# Patient Record
Sex: Female | Born: 1960 | Hispanic: No | Marital: Single | Smoking: Never smoker
Health system: Southern US, Community
[De-identification: ages and names within clinical notes are randomized; demographics above are authoritative.]

## PROBLEM LIST (undated history)

## (undated) DIAGNOSIS — J45909 Unspecified asthma, uncomplicated: Secondary | ICD-10-CM

## (undated) DIAGNOSIS — K219 Gastro-esophageal reflux disease without esophagitis: Secondary | ICD-10-CM

---

## 2012-07-22 ENCOUNTER — Encounter (HOSPITAL_COMMUNITY): Payer: Self-pay | Admitting: Emergency Medicine

## 2012-07-22 ENCOUNTER — Emergency Department (HOSPITAL_COMMUNITY)
Admission: EM | Admit: 2012-07-22 | Discharge: 2012-07-22 | Disposition: A | Discharge status: Home / Self Care | Payer: BC Managed Care – PPO | Attending: Emergency Medicine | Admitting: Emergency Medicine

## 2012-07-22 DIAGNOSIS — R61 Generalized hyperhidrosis: Secondary | ICD-10-CM | POA: Insufficient documentation

## 2012-07-22 DIAGNOSIS — F411 Generalized anxiety disorder: Secondary | ICD-10-CM | POA: Insufficient documentation

## 2012-07-22 DIAGNOSIS — J45909 Unspecified asthma, uncomplicated: Secondary | ICD-10-CM | POA: Insufficient documentation

## 2012-07-22 DIAGNOSIS — L500 Allergic urticaria: Secondary | ICD-10-CM | POA: Insufficient documentation

## 2012-07-22 DIAGNOSIS — R0682 Tachypnea, not elsewhere classified: Secondary | ICD-10-CM | POA: Insufficient documentation

## 2012-07-22 DIAGNOSIS — T50995A Adverse effect of other drugs, medicaments and biological substances, initial encounter: Secondary | ICD-10-CM | POA: Insufficient documentation

## 2012-07-22 DIAGNOSIS — T782XXA Anaphylactic shock, unspecified, initial encounter: Secondary | ICD-10-CM

## 2012-07-22 DIAGNOSIS — R000 Tachycardia, unspecified: Secondary | ICD-10-CM | POA: Insufficient documentation

## 2012-07-22 HISTORY — DX: Unspecified asthma, uncomplicated: J45.909

## 2012-07-22 MED ORDER — EPINEPHRINE 0.3 MG/0.3ML IJ SOAJ
0.3000 mg | INTRAMUSCULAR | Status: AC
  Administered 2012-07-22: 0.3 mg via INTRAMUSCULAR

## 2012-07-22 MED ORDER — SODIUM CHLORIDE 0.9 % IV SOLN
Freq: Once | INTRAVENOUS | Status: AC
  Administered 2012-07-22: 11:00:00 via INTRAVENOUS

## 2012-07-22 MED ORDER — DIPHENHYDRAMINE HCL 25 MG PO CAPS: 25.0000 mg | ORAL_CAPSULE | Freq: Three times a day (TID) | ORAL | Status: DC

## 2012-07-22 MED ORDER — ALBUTEROL SULFATE (5 MG/ML) 0.5% IN NEBU
2.5000 mg | INHALATION_SOLUTION | Freq: Once | RESPIRATORY_TRACT | Status: AC
  Administered 2012-07-22: 2.5 mg via RESPIRATORY_TRACT
  Filled 2012-07-22: qty 0.5

## 2012-07-22 MED ORDER — DEXAMETHASONE SODIUM PHOSPHATE 10 MG/ML IJ SOLN
10.0000 mg | Freq: Once | INTRAMUSCULAR | Status: AC
  Administered 2012-07-22: 10 mg via INTRAVENOUS
  Filled 2012-07-22: qty 1

## 2012-07-22 MED ORDER — DIPHENHYDRAMINE HCL 50 MG/ML IJ SOLN
50.0000 mg | Freq: Once | INTRAMUSCULAR | Status: AC
  Administered 2012-07-22: 50 mg via INTRAVENOUS
  Filled 2012-07-22: qty 1

## 2012-07-22 MED ORDER — PREDNISONE 20 MG PO TABS: 60.0000 mg | ORAL_TABLET | Freq: Every day | ORAL | Status: DC

## 2012-07-22 MED ORDER — FAMOTIDINE 20 MG PO TABS: 20.0000 mg | ORAL_TABLET | Freq: Two times a day (BID) | ORAL | Status: DC

## 2012-07-22 MED ORDER — EPINEPHRINE 0.3 MG/0.3ML IJ SOAJ
INTRAMUSCULAR | Status: AC
  Filled 2012-07-22: qty 0.3

## 2012-07-22 MED ORDER — IPRATROPIUM BROMIDE 0.02 % IN SOLN
0.5000 mg | Freq: Once | RESPIRATORY_TRACT | Status: AC
  Administered 2012-07-22: 0.5 mg via RESPIRATORY_TRACT
  Filled 2012-07-22: qty 2.5

## 2012-07-22 MED ORDER — FAMOTIDINE IN NACL 20-0.9 MG/50ML-% IV SOLN
20.0000 mg | Freq: Once | INTRAVENOUS | Status: AC
  Administered 2012-07-22: 20 mg via INTRAVENOUS
  Filled 2012-07-22: qty 50

## 2012-07-22 NOTE — ED Provider Notes (Signed)
History     CSN: [id number]  Arrival date & time 07/22/12  1028   First MD Initiated Contact with Patient 07/22/12 1036      Chief Complaint  Patient presents with  . Allergic Reaction     HPI Patient presents in respiratory distress. She states that she was in her usual state of health, until soon after receiving allergy injections in both arms.  The patient is unclear as to what the injections were, but states that she is receiving therapy for multiple environmental allergies. Soon after receiving her treatments she started having difficulty breathing, tongue swelling, began feeling generally poor. She attempted to give herself one EpiPen treatment, but did not complete the treatment. Since onset has been progressive. She denies syncope, but states that she is having increasing difficulty breathing, and this is through garbled speech.  Past Medical History  Diagnosis Date  . Asthma     History reviewed. No pertinent past surgical history.  No family history on file.  History  Substance Use Topics  . Smoking status: Never Smoker   . Smokeless tobacco: Not on file  . Alcohol Use: Yes    OB History   Grav Para Term Preterm Abortions TAB SAB Ect Mult Living                  Review of Systems  Unable to perform ROS: Acuity of condition    Allergies  Review of patient's allergies indicates not on file.  Home Medications  No current outpatient prescriptions on file.  BP 159/129  Pulse 106  Temp(Src) 98.3 F (36.8 C) (Oral)  Resp 16  SpO2 94%  LMP 07/13/2012  Physical Exam  Nursing note and vitals reviewed. Constitutional: She is oriented to person, place, and time. She appears well-developed and well-nourished. She appears distressed.  HENT:  Head: Normocephalic.  Mouth/Throat: Uvula is midline and mucous membranes are normal. No edematous.    Eyes: Conjunctivae are normal. Right eye exhibits no discharge. Left eye exhibits no discharge.  Neck: No  rigidity. No tracheal deviation and no edema present.  Cardiovascular: Regular rhythm.  Tachycardia present.   Pulmonary/Chest: Accessory muscle usage present. Tachypnea noted. She is in respiratory distress. She has decreased breath sounds. She has no wheezes.  Abdominal: Soft. She exhibits no distension.  Musculoskeletal: She exhibits no edema.  Neurological: She is alert and oriented to person, place, and time. No cranial nerve deficit. She exhibits normal muscle tone. Coordination normal.  Skin: Rash noted. Rash is urticarial. She is diaphoretic.  Urticarial rash in both upper extremities, proximally, more pronounced on the left.  There is mild confluent erythema.  No discharge, no bleeding.  Psychiatric: Her mood appears anxious.    ED Course  Procedures (including critical care time) Cardiac: 111 - st, abnormal  O2- 99% w supplemental O2- abnormal  Immediately after my initial exam, epi-pen was applied.  She then received IVF, meds, neb.  I spoke with the patient's allergists ([place] [facility] [phone]).  The patient received multiple injections as a desensitization program (both arms).    Labs Reviewed - No data to display No results found.   No diagnosis found.  10:55 AM Patient improving w supplemental O2 - nebs, epi-pen, decadron, pepcid, benadryl, ns all provided (or in process)  Update: I spoke with the patient's daughter about her condition. Patient appears to be improving.  11:20 AM Patient appears better  12:59 PM Patient is substantially better  3:11 PM Patient has been observed  for 4 hours.  On repeat exam the patient is awake alert, tongue edema is resolved entirely.  We discussed return precautions, need followup, and she was eventually appropriate for discharge.  MDM  Patient presents in extremis, with hypoxia, tachycardia, tachypnea, glossal edema, diaphoresis, all concerning for anaphylaxis, particularly in light of her recent allergy desensitization  injection. Empirically the patient received epinephrine, steroids, antihistamines, fluids.  Over time, the patient did improve, gradually. Following this improvement, appeared observation to insure no acute decompensation, and a discussion with the patient's allergists, the patient was appropriate for discharge, though with close followup, and return precautions.   CRITICAL CARE Performed by: Gerhard Munch Total critical care time: 35 Critical care time was exclusive of separately billable procedures and treating other patients. Critical care was necessary to treat or prevent imminent or life-threatening deterioration. Critical care was time spent personally by me on the following activities: development of treatment plan with patient and/or surrogate as well as nursing, discussions with consultants, evaluation of patient's response to treatment, examination of patient, obtaining history from patient or surrogate, ordering and performing treatments and interventions, ordering and review of laboratory studies, ordering and review of radiographic studies, pulse oximetry and re-evaluation of patient's condition.      Gerhard Munch, MD 07/22/12 [phone]

## 2012-07-22 NOTE — ED Notes (Signed)
Per pt got allergy shot this am-dose was increased-tongue started burning and swelling, difficulty breathing-gave self EPI pen but "did not hold it long enough"

## 2014-11-14 DIAGNOSIS — J3089 Other allergic rhinitis: Secondary | ICD-10-CM | POA: Insufficient documentation

## 2014-11-14 DIAGNOSIS — J4540 Moderate persistent asthma, uncomplicated: Secondary | ICD-10-CM | POA: Insufficient documentation

## 2014-12-03 ENCOUNTER — Ambulatory Visit (INDEPENDENT_AMBULATORY_CARE_PROVIDER_SITE_OTHER): Payer: BC Managed Care – PPO

## 2014-12-03 DIAGNOSIS — J309 Allergic rhinitis, unspecified: Secondary | ICD-10-CM

## 2014-12-10 ENCOUNTER — Ambulatory Visit (INDEPENDENT_AMBULATORY_CARE_PROVIDER_SITE_OTHER): Payer: BC Managed Care – PPO

## 2014-12-10 DIAGNOSIS — J309 Allergic rhinitis, unspecified: Secondary | ICD-10-CM | POA: Diagnosis not present

## 2015-01-05 ENCOUNTER — Ambulatory Visit (INDEPENDENT_AMBULATORY_CARE_PROVIDER_SITE_OTHER): Payer: BC Managed Care – PPO

## 2015-01-05 DIAGNOSIS — J309 Allergic rhinitis, unspecified: Secondary | ICD-10-CM

## 2015-01-26 ENCOUNTER — Ambulatory Visit (INDEPENDENT_AMBULATORY_CARE_PROVIDER_SITE_OTHER): Payer: BC Managed Care – PPO | Admitting: *Deleted

## 2015-01-26 DIAGNOSIS — J309 Allergic rhinitis, unspecified: Secondary | ICD-10-CM

## 2015-02-24 ENCOUNTER — Telehealth: Payer: Self-pay

## 2015-02-24 MED ORDER — PREDNISONE 10 MG PO TABS: ORAL_TABLET | ORAL | Status: DC

## 2015-02-24 NOTE — Telephone Encounter (Signed)
Patient has been sick for over 2 weeks. She has tried a number of things and she is using her inhalers much more then usual. She is wondering if we can call in her some prednisone to help clear this up.  Patient uses [facility] [address].

## 2015-02-24 NOTE — Telephone Encounter (Signed)
Medication sent in and patient notified. 

## 2015-02-24 NOTE — Telephone Encounter (Addendum)
Called and spoke with patient who has been having stuffy nose , cough, runny eyes, and over the weekend some slight streak of blood in mucus. Patient is asking if she could get some prednisone to help with sickness?

## 2015-02-24 NOTE — Telephone Encounter (Signed)
Please call in the following: Prednisone has been provided, 20 mg x 4 days, 10 mg x1 day, then stop. These asked her to make a follow up appointment in the near future, particularly if she continues to notice blood streaking in mucus. Thanks.

## 2015-02-24 NOTE — Telephone Encounter (Signed)
This is not an Epic patient, please put the patient's chart on my desk.  Thanks.

## 2015-03-05 ENCOUNTER — Encounter: Payer: Self-pay | Admitting: Allergy and Immunology

## 2015-03-05 ENCOUNTER — Ambulatory Visit (INDEPENDENT_AMBULATORY_CARE_PROVIDER_SITE_OTHER): Payer: BC Managed Care – PPO | Admitting: Allergy and Immunology

## 2015-03-05 VITALS — BP 110/68 | HR 80 | Temp 98.5°F | Resp 18

## 2015-03-05 DIAGNOSIS — J0120 Acute ethmoidal sinusitis, unspecified: Secondary | ICD-10-CM | POA: Diagnosis not present

## 2015-03-05 DIAGNOSIS — J45901 Unspecified asthma with (acute) exacerbation: Secondary | ICD-10-CM | POA: Diagnosis not present

## 2015-03-05 DIAGNOSIS — J3089 Other allergic rhinitis: Secondary | ICD-10-CM

## 2015-03-05 DIAGNOSIS — J019 Acute sinusitis, unspecified: Secondary | ICD-10-CM | POA: Insufficient documentation

## 2015-03-05 MED ORDER — IPRATROPIUM-ALBUTEROL 0.5-2.5 (3) MG/3ML IN SOLN
3.0000 mL | Freq: Once | RESPIRATORY_TRACT | Status: AC
  Administered 2015-03-05: 3 mL via RESPIRATORY_TRACT

## 2015-03-05 MED ORDER — PREDNISONE 1 MG PO TABS: 10.0000 mg | ORAL_TABLET | ORAL | Status: DC

## 2015-03-05 MED ORDER — METHYLPREDNISOLONE ACETATE 80 MG/ML IJ SUSP
80.0000 mg | Freq: Once | INTRAMUSCULAR | Status: AC
  Administered 2015-03-05: 80 mg via INTRAMUSCULAR

## 2015-03-05 NOTE — Assessment & Plan Note (Addendum)
   Depo-Medrol 80 mg was administered in the office.  Prednisone has been provided and is to be started tomorrow as follows: 20 mg daily x 4 days, 10 mg x1 day, then stop.  Continue Symbicort 160/4.5, 2 inhalations twice a day, montelukast 10 mg daily, and albuterol every 4-6 hours as needed.  To maximize pulmonary deposition, a spacer has been provided along with instructions for its proper administration with an HFA inhaler.  The patient has been asked to contact me if her symptoms persist, progress, or if she becomes febrile. Otherwise, she may return for follow up in 4 months.

## 2015-03-05 NOTE — Assessment & Plan Note (Signed)
   Continue appropriate allergen avoidance, montelukast 10 mg daily, and Nasonex daily as needed.

## 2015-03-05 NOTE — Progress Notes (Signed)
[facility]  Follow-up Note  RE: Anara Cowman MRN: [id number] DOB: 04/18/1960 Date of Office Visit: 03/05/2015  Primary care provider: No primary care provider on file. Referring provider: No ref. provider found  History of present illness: HPI Comments: Donna Case is a 54 y.o. female with persistent asthma and allergic rhinitis who presents today for sick visit.  She was previously seen in this office in January 2016.  She complains of sinus pressure/pain, nasal congestion, and thick postnasal drainage.   These symptoms have been present over the past week.  She denies fevers, chills, and discolored mucus production.   She also complains of increased albuterol requirement due to coughing, chest tightness, and wheezing.  Over this past week she has been requiring albuterol rescue daily and has experienced nocturnal awakenings due to lower respiratory symptoms.   Assessment and plan: Asthma with acute exacerbation  Depo-Medrol 80 mg was administered in the office.  Prednisone has been provided and is to be started tomorrow as follows: 20 mg daily x 4 days, 10 mg x1 day, then stop.  Continue Symbicort 160/4.5, 2 inhalations twice a day, montelukast 10 mg daily, and albuterol every 4-6 hours as needed.  To maximize pulmonary deposition, a spacer has been provided along with instructions for its proper administration with an HFA inhaler.  The patient has been asked to contact me if her symptoms persist, progress, or if she becomes febrile. Otherwise, she may return for follow up in 4 months.  Acute sinusitis  Depo-Medrol injection and prednisone have been provided (as above).  Nasal saline lavage (NeilMed) as needed has been recommended along with instructions for proper administration.  Guaifenesin 1200 mg (plus/minus pseudoephedrine 120 mg) twice daily as needed with adequate hydration. Pseudoephedrine is only to be used for  short-term relief of nasal/sinus congestion. Long-term use is discouraged due to potential side effects.   Continue Nasonex daily as needed.  Allergic rhinitis  Continue appropriate allergen avoidance, montelukast 10 mg daily, and Nasonex daily as needed.    Medications ordered this encounter: Meds ordered this encounter  Medications  . methylPREDNISolone acetate (DEPO-MEDROL) injection 80 mg    Sig:   . predniSONE (DELTASONE) tablet 10 mg    Sig:   . ipratropium-albuterol (DUONEB) 0.5-2.5 (3) MG/3ML nebulizer solution 3 mL    Sig:     Diagnositics: Spirometry reveals FVC of 3.25 L (95% predicted) and an FEV1 of 2.16 L (79% predicted) with significant (590 mL, 27%) post bronchodilator improvement.    Physical examination: Blood pressure 110/68, pulse 80, temperature 98.5 F (36.9 C), temperature source Oral, resp. rate 18.  General: Alert, interactive, in no acute distress. HEENT: TMs pearly gray, turbinates markedly edematous with thick discharge, post-pharynx erythematous. Neck: Supple without lymphadenopathy. Lungs: Mildly decreased breath sounds bilaterally without wheezing, rhonchi or rales. CV: Normal S1, S2 without murmurs. Skin: Warm and dry, without lesions or rashes.  The following portions of the patient's history were reviewed and updated as appropriate: allergies, current medications, past family history, past medical history, past social history, past surgical history and problem list.  Outpatient medications:   Medication List       This list is accurate as of: 03/05/15  2:18 PM.  Always use your most recent med list.               albuterol 108 (90 Base) MCG/ACT inhaler  Commonly known as:  PROVENTIL HFA;VENTOLIN HFA  Inhale 2 puffs  into the lungs every 6 (six) hours as needed for wheezing.     budesonide-formoterol 160-4.5 MCG/ACT inhaler  Commonly known as:  SYMBICORT  Inhale 2 puffs into the lungs 2 (two) times daily.     diphenhydrAMINE 25  mg capsule  Commonly known as:  BENADRYL  Take 1 capsule (25 mg total) by mouth 3 (three) times daily.     DULERA 200-5 MCG/ACT Aero  Generic drug:  mometasone-formoterol  Inhale 2 puffs into the lungs 2 (two) times daily. Reported on 03/05/2015     famotidine 20 MG tablet  Commonly known as:  PEPCID  Take 1 tablet (20 mg total) by mouth 2 (two) times daily.     ibuprofen 800 MG tablet  Commonly known as:  ADVIL,MOTRIN  Take 800 mg by mouth daily as needed for pain.     montelukast 10 MG tablet  Commonly known as:  SINGULAIR  Take 10 mg by mouth at bedtime.     NASONEX 50 MCG/ACT nasal spray  Generic drug:  mometasone  Place 2 sprays into the nose daily.     omeprazole 20 MG capsule  Commonly known as:  PRILOSEC  Take 20 mg by mouth daily.     predniSONE 20 MG tablet  Commonly known as:  DELTASONE  Take 3 tablets (60 mg total) by mouth daily.     predniSONE 10 MG tablet  Commonly known as:  DELTASONE  Take 2 tablets for 4 days then 1 tablet on the fifth day.     PRESCRIPTION MEDICATION        Known medication allergies: Allergies  Allergen Reactions  . Hydrocodone Other (See Comments)    hallucintatons  . Latex Itching   Review of systems: Constitutional: Negative for fever, chills and weight loss.  HENT: Negative for nosebleeds.  Positive for sinus pressure/pain, nasal congestion, postnasal drainage. Eyes: Negative for blurred vision.  Respiratory: Negative for hemoptysis.  Positive for coughing, dyspnea, wheezing. Cardiovascular: Negative for chest pain.  Gastrointestinal: Negative for diarrhea and constipation.  Genitourinary: Negative for dysuria.  Musculoskeletal: Negative for myalgias and joint pain.  Neurological: Negative for dizziness.  Endo/Heme/Allergies: Does not bruise/bleed easily.   Past Medical History  Diagnosis Date  . Asthma     No family history on file.  Social History   Social History  . Marital Status: Unknown    Spouse  Name: N/A  . Number of Children: N/A  . Years of Education: N/A   Occupational History  . Not on file.   Social History Main Topics  . Smoking status: Never Smoker   . Smokeless tobacco: Not on file  . Alcohol Use: Yes  . Drug Use: No  . Sexual Activity: Not on file   Other Topics Concern  . Not on file   Social History Narrative    I appreciate the opportunity to take part in this Idamay's care. Please do not hesitate to contact me with questions.  Sincerely,   R. Jorene Guestarter Nadalee Neiswender, MD

## 2015-03-05 NOTE — Assessment & Plan Note (Signed)
   Depo-Medrol injection and prednisone have been provided (as above).  Nasal saline lavage (NeilMed) as needed has been recommended along with instructions for proper administration.  Guaifenesin 1200 mg (plus/minus pseudoephedrine 120 mg) twice daily as needed with adequate hydration. Pseudoephedrine is only to be used for short-term relief of nasal/sinus congestion. Long-term use is discouraged due to potential side effects.   Continue Nasonex daily as needed.

## 2015-03-05 NOTE — Patient Instructions (Addendum)
Asthma with acute exacerbation  Depo-Medrol 80 mg was administered in the office.  Prednisone has been provided and is to be started tomorrow as follows: 20 mg daily x 4 days, 10 mg x1 day, then stop.  Continue Symbicort 160/4.5, 2 inhalations twice a day, montelukast 10 mg daily, and albuterol every 4-6 hours as needed.  To maximize pulmonary deposition, a spacer has been provided along with instructions for its proper administration with an HFA inhaler.  The patient has been asked to contact me if her symptoms persist, progress, or if she becomes febrile. Otherwise, she may return for follow up in 4 months.  Acute sinusitis  Depo-Medrol injection and prednisone have been provided (as above).  Nasal saline lavage (NeilMed) as needed has been recommended along with instructions for proper administration.  Guaifenesin 1200 mg (plus/minus pseudoephedrine 120 mg) twice daily as needed with adequate hydration. Pseudoephedrine is only to be used for short-term relief of nasal/sinus congestion. Long-term use is discouraged due to potential side effects.   Continue Nasonex daily as needed.  Allergic rhinitis  Continue appropriate allergen avoidance, montelukast 10 mg daily, and Nasonex daily as needed.    Return in about 4 months (around 07/04/2015), or if symptoms worsen or fail to improve.

## 2015-03-25 ENCOUNTER — Ambulatory Visit (INDEPENDENT_AMBULATORY_CARE_PROVIDER_SITE_OTHER): Payer: BC Managed Care – PPO

## 2015-03-25 DIAGNOSIS — J309 Allergic rhinitis, unspecified: Secondary | ICD-10-CM

## 2015-04-01 ENCOUNTER — Ambulatory Visit (INDEPENDENT_AMBULATORY_CARE_PROVIDER_SITE_OTHER): Payer: BC Managed Care – PPO | Admitting: Neurology

## 2015-04-01 DIAGNOSIS — J309 Allergic rhinitis, unspecified: Secondary | ICD-10-CM | POA: Diagnosis not present

## 2015-04-08 ENCOUNTER — Ambulatory Visit (INDEPENDENT_AMBULATORY_CARE_PROVIDER_SITE_OTHER): Payer: BC Managed Care – PPO

## 2015-04-08 DIAGNOSIS — J309 Allergic rhinitis, unspecified: Secondary | ICD-10-CM | POA: Diagnosis not present

## 2015-04-15 ENCOUNTER — Ambulatory Visit (INDEPENDENT_AMBULATORY_CARE_PROVIDER_SITE_OTHER): Payer: BC Managed Care – PPO

## 2015-04-15 DIAGNOSIS — J309 Allergic rhinitis, unspecified: Secondary | ICD-10-CM

## 2015-04-22 ENCOUNTER — Ambulatory Visit (INDEPENDENT_AMBULATORY_CARE_PROVIDER_SITE_OTHER): Payer: BC Managed Care – PPO

## 2015-04-22 DIAGNOSIS — J309 Allergic rhinitis, unspecified: Secondary | ICD-10-CM

## 2015-05-13 ENCOUNTER — Ambulatory Visit (INDEPENDENT_AMBULATORY_CARE_PROVIDER_SITE_OTHER): Payer: BC Managed Care – PPO

## 2015-05-13 DIAGNOSIS — J309 Allergic rhinitis, unspecified: Secondary | ICD-10-CM

## 2015-05-27 ENCOUNTER — Ambulatory Visit (INDEPENDENT_AMBULATORY_CARE_PROVIDER_SITE_OTHER): Payer: BC Managed Care – PPO

## 2015-05-27 DIAGNOSIS — J309 Allergic rhinitis, unspecified: Secondary | ICD-10-CM

## 2015-06-21 ENCOUNTER — Other Ambulatory Visit: Payer: Self-pay | Admitting: Allergy and Immunology

## 2015-06-24 ENCOUNTER — Other Ambulatory Visit: Payer: Self-pay

## 2015-06-24 ENCOUNTER — Telehealth: Payer: Self-pay

## 2015-06-24 MED ORDER — ALBUTEROL SULFATE HFA 108 (90 BASE) MCG/ACT IN AERS
2.0000 | INHALATION_SPRAY | Freq: Four times a day (QID) | RESPIRATORY_TRACT | Status: DC | PRN
Start: 2015-06-24 — End: 2015-07-05

## 2015-06-24 NOTE — Telephone Encounter (Signed)
SENT IN REFILL FOR VENTOLIN

## 2015-06-24 NOTE — Telephone Encounter (Signed)
Pt went to pick up her meds and she was wondering could she get a refill also on ventolin .  Please Advise  Thanks

## 2015-07-05 ENCOUNTER — Encounter: Payer: Self-pay | Admitting: Allergy and Immunology

## 2015-07-05 ENCOUNTER — Ambulatory Visit (INDEPENDENT_AMBULATORY_CARE_PROVIDER_SITE_OTHER): Payer: BC Managed Care – PPO | Admitting: Allergy and Immunology

## 2015-07-05 VITALS — BP 148/70 | HR 80 | Temp 98.1°F | Resp 20

## 2015-07-05 DIAGNOSIS — J0110 Acute frontal sinusitis, unspecified: Secondary | ICD-10-CM | POA: Diagnosis not present

## 2015-07-05 DIAGNOSIS — J45901 Unspecified asthma with (acute) exacerbation: Secondary | ICD-10-CM

## 2015-07-05 DIAGNOSIS — J3089 Other allergic rhinitis: Secondary | ICD-10-CM | POA: Diagnosis not present

## 2015-07-05 MED ORDER — PREDNISONE 1 MG PO TABS: 10.0000 mg | ORAL_TABLET | ORAL | Status: DC

## 2015-07-05 MED ORDER — MONTELUKAST SODIUM 10 MG PO TABS: 10.0000 mg | ORAL_TABLET | Freq: Every day | ORAL | Status: DC

## 2015-07-05 MED ORDER — ALBUTEROL SULFATE HFA 108 (90 BASE) MCG/ACT IN AERS: 2.0000 | INHALATION_SPRAY | Freq: Four times a day (QID) | RESPIRATORY_TRACT | Status: DC | PRN

## 2015-07-05 MED ORDER — MOMETASONE FUROATE 50 MCG/ACT NA SUSP: 2.0000 | Freq: Every day | NASAL | Status: DC

## 2015-07-05 MED ORDER — MOMETASONE FURO-FORMOTEROL FUM 200-5 MCG/ACT IN AERO: 2.0000 | INHALATION_SPRAY | Freq: Two times a day (BID) | RESPIRATORY_TRACT | Status: DC

## 2015-07-05 NOTE — Progress Notes (Signed)
Follow-up Note  RE: Donna SachsRachel Degen MRN: [id number] DOB: 10/13/1960 Date of Office Visit: 07/05/2015  Primary care provider: No PCP Per Patient Referring provider: No ref. provider found  History of present illness: HPI Comments: Donna Case is a 55 y.o. female with persistent asthma and allergic rhinitis who presents today for follow up.  She was last seen in this clinic on 03/05/2015.  She reports that her upper and lower respiratory symptoms have been reasonably well-controlled until to tree pollens emerged.  Over the past few weeks she has been experiencing increased coughing, dyspnea, chest tightness, wheezing, and albuterol requirement.  She currently uses Symbicort 160/4.5, 2 inhalations twice a day.  She admits that she has not been using a spacer device with her HFA inhalers.  She has been using albuterol rescue multiple times per day and experiencing frequent nocturnal awakenings due to lower respiratory symptoms.  In addition, she has been experiencing increased nasal congestion, rhinorrhea, postnasal drainage, and headaches.   Assessment and plan: Asthma with acute exacerbation  Prednisone has been provided, 40 mg x3 days, 20 mg x1 day, 10 mg x1 day, then stop.  [facility], 2 inhalations twice a day.  Discontinue Symbicort.   I have encouraged her to use her spacer device with HFA inhalers.  Continue montelukast 10 mg daily bedtime and albuterol every 4-6 hours as needed.  The patient has been asked to contact me if her symptoms persist or progress. Otherwise, she may return for follow up in 4 months.  Acute sinusitis  Prednisone has been provided (as above).  Nasal saline lavage (NeilMed) as needed has been recommended along with instructions for proper administration.  Guaifenesin 1200 mg twice daily as needed with adequate hydration.   Continue Nasonex daily as needed.  Allergic rhinitis  Continue appropriate allergen avoidance, montelukast  10 mg daily, and Nasonex daily as needed.  Resume aeroallergen immunotherapy when asthma exacerbation and sinusitis have resolved.    Meds ordered this encounter  Medications  . albuterol (PROVENTIL HFA;VENTOLIN HFA) 108 (90 Base) MCG/ACT inhaler    Sig: Inhale 2 puffs into the lungs every 6 (six) hours as needed for wheezing.    Dispense:  1 Inhaler    Refill:  1  . montelukast (SINGULAIR) 10 MG tablet    Sig: Take 1 tablet (10 mg total) by mouth at bedtime. Reported on 07/05/2015    Dispense:  30 tablet    Refill:  5  . mometasone-formoterol (DULERA) 200-5 MCG/ACT AERO    Sig: Inhale 2 puffs into the lungs 2 (two) times daily.    Dispense:  1 Inhaler    Refill:  5  . mometasone (NASONEX) 50 MCG/ACT nasal spray    Sig: Place 2 sprays into the nose daily.    Dispense:  17 g    Refill:  5  . predniSONE (DELTASONE) tablet 10 mg    Sig:     Diagnositics: Spirometry reveals an FVC of 2.89 L and an FEV1 of 2.24 L (83% predicted) with significant (710 mL, 32%) postbronchodilator improvement.    Physical examination: Blood pressure 148/70, pulse 80, temperature 98.1 F (36.7 C), temperature source Oral, resp. rate 20.  General: Alert, interactive, in no acute distress. HEENT: TMs pearly gray, turbinates edematous without discharge, post-pharynx moderately erythematous. Neck: Supple without lymphadenopathy. Lungs: Mildly decreased breath sounds bilaterally without wheezing, rhonchi or rales. CV: Normal S1, S2 without murmurs. Skin: Warm and dry, without lesions or rashes.  The following portions of the patient's history were reviewed and updated as appropriate: allergies, current medications, past family history, past medical history, past social history, past surgical history and problem list.    Medication List       This list is accurate as of: 07/05/15  4:08 PM.  Always use your most recent med list.               albuterol 108 (90 Base) MCG/ACT inhaler  Commonly known  as:  PROVENTIL HFA;VENTOLIN HFA  Inhale 2 puffs into the lungs every 6 (six) hours as needed for wheezing.     fexofenadine 180 MG tablet  Commonly known as:  ALLEGRA  take 1 tablet by mouth at bedtime for allergies     fluticasone 50 MCG/ACT nasal spray  Commonly known as:  FLONASE     ibuprofen 800 MG tablet  Commonly known as:  ADVIL,MOTRIN  Take 800 mg by mouth daily as needed for pain.     mometasone 50 MCG/ACT nasal spray  Commonly known as:  NASONEX  Place 2 sprays into the nose daily.     mometasone-formoterol 200-5 MCG/ACT Aero  Commonly known as:  DULERA  Inhale 2 puffs into the lungs 2 (two) times daily.     montelukast 10 MG tablet  Commonly known as:  SINGULAIR  Take 1 tablet (10 mg total) by mouth at bedtime. Reported on 07/05/2015     omeprazole 20 MG capsule  Commonly known as:  PRILOSEC  Take 20 mg by mouth daily.     SYMBICORT 160-4.5 MCG/ACT inhaler  Generic drug:  budesonide-formoterol  inhale 2 puffs by mouth twice a day        Allergies  Allergen Reactions  . Hydrocodone Other (See Comments)    hallucintatons  . Latex Itching   Review of systems: Constitutional: Negative for fever, chills and weight loss.  HENT: Negative for nosebleeds.   Positive for nasal congestion, postnasal drainage, headaches. Eyes: Negative for blurred vision.  Respiratory: Negative for hemoptysis.   Positive for coughing, chest tightness, dyspnea, wheezing. Cardiovascular: Negative for chest pain.  Gastrointestinal: Negative for diarrhea and constipation.  Genitourinary: Negative for dysuria.  Musculoskeletal: Negative for myalgias and joint pain.  Neurological: Negative for dizziness.  Endo/Heme/Allergies: Does not bruise/bleed easily.  Cutaneous: Negative for rash.  Past Medical History  Diagnosis Date  . Asthma     No family history on file.  Social History   Social History  . Marital Status: Unknown    Spouse Name: N/A  . Number of Children: N/A  .  Years of Education: N/A   Occupational History  . Not on file.   Social History Main Topics  . Smoking status: Never Smoker   . Smokeless tobacco: Not on file  . Alcohol Use: Yes  . Drug Use: No  . Sexual Activity: Not on file   Other Topics Concern  . Not on file   Social History Narrative    I appreciate the opportunity to take part in this Brinlynn's care. Please do not hesitate to contact me with questions.  Sincerely,   R. Jorene Guest, MD

## 2015-07-05 NOTE — Assessment & Plan Note (Signed)
   Continue appropriate allergen avoidance, montelukast 10 mg daily, and Nasonex daily as needed.  Resume aeroallergen immunotherapy when asthma exacerbation and sinusitis have resolved.

## 2015-07-05 NOTE — Assessment & Plan Note (Addendum)
   Prednisone has been provided, 40 mg x3 days, 20 mg x1 day, 10 mg x1 day, then stop.  [facility] g, 2 inhalations twice a day.  Discontinue Symbicort.   I have encouraged her to use her spacer device with HFA inhalers.  Continue montelukast 10 mg daily bedtime and albuterol every 4-6 hours as needed.  The patient has been asked to contact me if her symptoms persist or progress. Otherwise, she may return for follow up in 4 months.

## 2015-07-05 NOTE — Assessment & Plan Note (Addendum)
   Prednisone has been provided (as above).  Nasal saline lavage (NeilMed) as needed has been recommended along with instructions for proper administration.  Guaifenesin 1200 mg twice daily as needed with adequate hydration.   Continue Nasonex daily as needed.

## 2015-07-05 NOTE — Patient Instructions (Addendum)
Asthma with acute exacerbation  Prednisone has been provided, 40 mg x3 days, 20 mg x1 day, 10 mg x1 day, then stop.  [facility], 2 inhalations twice a day.  Discontinue Symbicort.   I have encouraged her to use her spacer device with HFA inhalers.  Continue montelukast 10 mg daily bedtime and albuterol every 4-6 hours as needed.  The patient has been asked to contact me if her symptoms persist or progress. Otherwise, she may return for follow up in 4 months.  Acute sinusitis  Prednisone has been provided (as above).  Nasal saline lavage (NeilMed) as needed has been recommended along with instructions for proper administration.  Guaifenesin 1200 mg twice daily as needed with adequate hydration.   Continue Nasonex daily as needed.  Allergic rhinitis  Continue appropriate allergen avoidance, montelukast 10 mg daily, and Nasonex daily as needed.  Resume aeroallergen immunotherapy when asthma exacerbation and sinusitis have resolved.    Return in about 4 months (around 11/05/2015), or if symptoms worsen or fail to improve.

## 2015-07-15 ENCOUNTER — Ambulatory Visit (INDEPENDENT_AMBULATORY_CARE_PROVIDER_SITE_OTHER): Payer: BC Managed Care – PPO

## 2015-07-15 DIAGNOSIS — J309 Allergic rhinitis, unspecified: Secondary | ICD-10-CM | POA: Diagnosis not present

## 2015-07-22 ENCOUNTER — Ambulatory Visit (INDEPENDENT_AMBULATORY_CARE_PROVIDER_SITE_OTHER): Payer: BC Managed Care – PPO

## 2015-07-22 DIAGNOSIS — J309 Allergic rhinitis, unspecified: Secondary | ICD-10-CM | POA: Diagnosis not present

## 2015-08-13 ENCOUNTER — Ambulatory Visit (INDEPENDENT_AMBULATORY_CARE_PROVIDER_SITE_OTHER): Payer: BC Managed Care – PPO | Admitting: *Deleted

## 2015-08-13 DIAGNOSIS — J309 Allergic rhinitis, unspecified: Secondary | ICD-10-CM | POA: Diagnosis not present

## 2015-08-26 ENCOUNTER — Ambulatory Visit (INDEPENDENT_AMBULATORY_CARE_PROVIDER_SITE_OTHER): Payer: BC Managed Care – PPO

## 2015-08-26 DIAGNOSIS — J309 Allergic rhinitis, unspecified: Secondary | ICD-10-CM | POA: Diagnosis not present

## 2015-09-16 ENCOUNTER — Ambulatory Visit (INDEPENDENT_AMBULATORY_CARE_PROVIDER_SITE_OTHER): Payer: BC Managed Care – PPO

## 2015-09-16 DIAGNOSIS — J309 Allergic rhinitis, unspecified: Secondary | ICD-10-CM | POA: Diagnosis not present

## 2015-10-15 ENCOUNTER — Encounter: Payer: Self-pay | Admitting: Allergy

## 2015-10-15 ENCOUNTER — Ambulatory Visit (INDEPENDENT_AMBULATORY_CARE_PROVIDER_SITE_OTHER): Payer: BC Managed Care – PPO | Admitting: Allergy

## 2015-10-15 VITALS — BP 134/86 | HR 80 | Temp 98.3°F | Resp 16

## 2015-10-15 DIAGNOSIS — J301 Allergic rhinitis due to pollen: Secondary | ICD-10-CM

## 2015-10-15 DIAGNOSIS — J4540 Moderate persistent asthma, uncomplicated: Secondary | ICD-10-CM | POA: Diagnosis not present

## 2015-10-15 NOTE — Patient Instructions (Addendum)
1. Allergic rhinitis due to pollen Severe nasal congestion with near complete blockage.  Start nasal saline rinse daily followed by Afrin 1 spray each nostril (use Afrin daily for no longer than 3 days).  Wait several minutes then use Flonase 2 sprays daily.  Continue Flonase daily for maximum benefit.    Continue daily Allegra and Singulair at night.     2. Asthma, moderate persistent Not-well controlled given frequency of albuterol use.   Change asthma controller medication to Breo 200/25 1 puff daily.  Use albuterol as needed.  Asthma control goals:   Full participation in all desired activities (may need albuterol before activity)  Albuterol use two time or less a week on average (not counting use with activity)  Cough interfering with sleep two time or less a month  Oral steroids no more than once a year  No hospitalizations   Follow-up 2 mo or sooner

## 2015-10-15 NOTE — Progress Notes (Signed)
Follow-up Note  RE: Donna Case MRN: [id number] DOB: 01/16/1961 Date of Office Visit: 10/15/2015   History of present illness: Donna SachsRachel Case is a 55 y.o. female presenting today for sick visit.  She was last seen in May 2017 by Dr. Nunzio CobbsBobbitt.  She recently returned from trip to [place] and reports started developing symptoms then which have worsened she her return.  She complains of increased nasal congestion and runny nose and difficulty breathing thru her nose.  She has tried doing saline rinse which she feels has not helped.  She has on occasion used an OTC "mist" that she reports is similar to afrin where she gets quick relief but then is "clogged up again".     Using Flonase occ couple times a week.  Occ allegra use as well.   With her asthma she has been using albuterol daily up to 4 times a day for chest tightness and wheezingwhich has worsened since going to [place].  She continues to use Dulera 2 puff twice daily.  No nighttime awakenings.  No steroids since last visit.   Hasn't been able to get her allergy shots due to her congestion.       Review of systems: Review of Systems  Constitutional: Negative for chills and fever.  HENT: Negative for sore throat.   Eyes: Negative for discharge and redness.  Cardiovascular: Negative for chest pain.  Gastrointestinal: Negative for nausea and vomiting.  Skin: Negative for rash.  Neurological: Negative for headaches.      Past medical/social/surgical/family history have been reviewed and are unchanged unless specifically indicated below.  No changes  Medication List:   Medication List       Accurate as of 10/15/15  1:17 PM. Always use your most recent med list.          albuterol 108 (90 Base) MCG/ACT inhaler Commonly known as:  PROVENTIL HFA;VENTOLIN HFA Inhale 2 puffs into the lungs every 6 (six) hours as needed for wheezing.   fexofenadine 180 MG tablet Commonly known as:  ALLEGRA take 1 tablet by mouth at bedtime for  allergies   fluticasone 50 MCG/ACT nasal spray Commonly known as:  FLONASE   ibuprofen 800 MG tablet Commonly known as:  ADVIL,MOTRIN Take 800 mg by mouth daily as needed for pain.   mometasone-formoterol 200-5 MCG/ACT Aero Commonly known as:  DULERA Inhale 2 puffs into the lungs 2 (two) times daily.   montelukast 10 MG tablet Commonly known as:  SINGULAIR Take 1 tablet (10 mg total) by mouth at bedtime. Reported on 07/05/2015   omeprazole 20 MG capsule Commonly known as:  PRILOSEC Take 20 mg by mouth daily.       Known medication allergies: Allergies  Allergen Reactions  . Hydrocodone Other (See Comments)    hallucintatons  . Latex Itching     Physical examination: Blood pressure 134/86, pulse 80, temperature 98.3 F (36.8 C), temperature source Oral, resp. rate 16.  General: Alert, interactive, in no acute distress. HEENT: TMs pearly gray, turbinates markedly edematous and pale with clear discharge with almost near occlusion, post-pharynx non erythematous. Neck: Supple without lymphadenopathy. Lungs: Clear to auscultation without wheezing, rhonchi or rales. {no increased work of breathing. CV: Normal S1, S2 without murmurs. Abdomen: Nondistended, nontender. Skin: Warm and dry, without lesions or rashes. Extremities:  No clubbing, cyanosis or edema. Neuro:   Grossly intact.  Diagnositics/Labs:  Spirometry: FEV1: 82%, FVC: 91%, ratio consistent with non-obstructive pattern.  Spirometry normal.  Assessment and plan:   1. Allergic rhinitis due to pollen Severe nasal congestion with near complete blockage.  Start nasal saline rinse daily followed by Afrin 1 spray each nostril (use Afrin daily for no longer than 3 days).  Wait several minutes then use Flonase 2 sprays daily.  Continue Flonase daily for maximum benefit.    Continue daily Allegra and Singulair at night.     2. Asthma, moderate persistent Not-well controlled given frequency of albuterol use.     Change asthma controller medication to Breo 200/25 1 puff daily.  Use albuterol as needed.  Asthma control goals:   Full participation in all desired activities (may need albuterol before activity)  Albuterol use two time or less a week on average (not counting use with activity)  Cough interfering with sleep two time or less a month  Oral steroids no more than once a year  No hospitalizations   Follow-up 2 mo or sooner   I appreciate the opportunity to take part in Donna Case's care. Please do not hesitate to contact me with questions.  Sincerely,   Margo Aye, MD Allergy/Immunology [facility]

## 2015-10-21 ENCOUNTER — Ambulatory Visit: Payer: Self-pay

## 2015-10-25 ENCOUNTER — Telehealth: Payer: Self-pay | Admitting: *Deleted

## 2015-10-25 NOTE — Telephone Encounter (Signed)
Marcelino DusterMichelle from [facility] called to inform us [facility] when she comes in for injection.  Marcelino DusterMichelle was going to call patient and inform patient of dose and stress to patient the importance of coming for injections on schedule.

## 2015-10-26 ENCOUNTER — Ambulatory Visit (INDEPENDENT_AMBULATORY_CARE_PROVIDER_SITE_OTHER): Payer: BC Managed Care – PPO

## 2015-10-26 DIAGNOSIS — J309 Allergic rhinitis, unspecified: Secondary | ICD-10-CM

## 2015-10-29 ENCOUNTER — Ambulatory Visit (INDEPENDENT_AMBULATORY_CARE_PROVIDER_SITE_OTHER): Payer: BC Managed Care – PPO

## 2015-10-29 DIAGNOSIS — J309 Allergic rhinitis, unspecified: Secondary | ICD-10-CM | POA: Diagnosis not present

## 2015-11-02 ENCOUNTER — Ambulatory Visit (INDEPENDENT_AMBULATORY_CARE_PROVIDER_SITE_OTHER): Payer: BC Managed Care – PPO | Admitting: *Deleted

## 2015-11-02 DIAGNOSIS — J309 Allergic rhinitis, unspecified: Secondary | ICD-10-CM

## 2015-11-12 ENCOUNTER — Ambulatory Visit (INDEPENDENT_AMBULATORY_CARE_PROVIDER_SITE_OTHER): Payer: BC Managed Care – PPO

## 2015-11-12 DIAGNOSIS — J309 Allergic rhinitis, unspecified: Secondary | ICD-10-CM

## 2015-11-18 ENCOUNTER — Other Ambulatory Visit: Payer: Self-pay | Admitting: *Deleted

## 2015-11-18 ENCOUNTER — Ambulatory Visit (INDEPENDENT_AMBULATORY_CARE_PROVIDER_SITE_OTHER): Payer: BC Managed Care – PPO | Admitting: *Deleted

## 2015-11-18 DIAGNOSIS — J309 Allergic rhinitis, unspecified: Secondary | ICD-10-CM

## 2015-11-18 MED ORDER — FLUTICASONE FUROATE-VILANTEROL 200-25 MCG/INH IN AEPB: 1.0000 | INHALATION_SPRAY | Freq: Every day | RESPIRATORY_TRACT | 3 refills | Status: DC

## 2015-12-02 ENCOUNTER — Ambulatory Visit (INDEPENDENT_AMBULATORY_CARE_PROVIDER_SITE_OTHER): Payer: BC Managed Care – PPO | Admitting: *Deleted

## 2015-12-02 DIAGNOSIS — J309 Allergic rhinitis, unspecified: Secondary | ICD-10-CM | POA: Diagnosis not present

## 2016-01-03 ENCOUNTER — Encounter: Payer: Self-pay | Admitting: Allergy

## 2016-01-03 ENCOUNTER — Ambulatory Visit (INDEPENDENT_AMBULATORY_CARE_PROVIDER_SITE_OTHER): Payer: BC Managed Care – PPO | Admitting: Allergy

## 2016-01-03 VITALS — BP 118/78 | HR 76 | Temp 98.3°F | Resp 16

## 2016-01-03 DIAGNOSIS — J3089 Other allergic rhinitis: Secondary | ICD-10-CM | POA: Diagnosis not present

## 2016-01-03 DIAGNOSIS — J4540 Moderate persistent asthma, uncomplicated: Secondary | ICD-10-CM

## 2016-01-03 MED ORDER — EPINEPHRINE 0.3 MG/0.3ML IJ SOAJ: 0.3000 mg | Freq: Once | INTRAMUSCULAR | 2 refills | Status: AC

## 2016-01-03 NOTE — Progress Notes (Signed)
Follow-up Note  RE: Donna SachsRachel Case MRN: [id number] DOB: 10/23/1960 Date of Office Visit: 01/03/2016   History of present illness: Donna Case is a 55 y.o. female presenting today for allergy testing and follow-up.  She was last seen in August 2017 by myself. She is being followed for her allergic rhinitis on allergen immunotherapy.  At that visit her asthma was not well controlled and [facility] was [facility] [place] of which she noted initial improvement in her symptoms and decrease use of albuterol. However after several weeks if you she states that the [facility] did not seem to be lasting a full 24 hours.  She states she started using Symbicort as Virgel BouquetBreo was not lasting long enough.  [place] recently [place] from [place] and reports that she had no problems with her asthma during her vacation there. She started having symptoms when she arrived back [place].    She returns today for allergy skin testing as she was getting her allergen immunotherapy from her allergist in [place] however she would like to have all of her care here and thus we need to provide skin testing in order to make her immunotherapy vials.   She continues to have severe nasal congestion and runny nose. She uses Flonase daily as well as Allegra and Singulair.      Review of systems: Review of Systems  Constitutional: Positive for fever. Negative for chills.  HENT: Positive for congestion. Negative for sore throat.   Eyes: Negative for redness.  Respiratory: Positive for cough, shortness of breath and wheezing.   Cardiovascular: Negative for chest pain.  Gastrointestinal: Negative for nausea and vomiting.  Skin: Negative for itching and rash.  Neurological: Negative for headaches.    All other systems negative unless noted above in HPI  Past medical/social/surgical/family history have been reviewed and are unchanged unless specifically indicated below.  No changes  Medication List:     Medication List       Accurate as of 01/03/16  5:39 PM. Always use your most recent med list.          albuterol 108 (90 Base) MCG/ACT inhaler Commonly known as:  PROVENTIL HFA;VENTOLIN HFA Inhale 2 puffs into the lungs every 6 (six) hours as needed for wheezing.   fexofenadine 180 MG tablet Commonly known as:  ALLEGRA take 1 tablet by mouth at bedtime for allergies   fluticasone 50 MCG/ACT nasal spray Commonly known as:  FLONASE   fluticasone furoate-vilanterol 200-25 MCG/INH Aepb Commonly known as:  BREO ELLIPTA Inhale 1 puff into the lungs daily.   ibuprofen 800 MG tablet Commonly known as:  ADVIL,MOTRIN Take 800 mg by mouth daily as needed for pain.   mometasone-formoterol 200-5 MCG/ACT Aero Commonly known as:  DULERA Inhale 2 puffs into the lungs 2 (two) times daily.   montelukast 10 MG tablet Commonly known as:  SINGULAIR Take 1 tablet (10 mg total) by mouth at bedtime. Reported on 07/05/2015   omeprazole 20 MG capsule Commonly known as:  PRILOSEC Take 20 mg by mouth daily.       Known medication allergies: Allergies  Allergen Reactions  . Hydrocodone Other (See Comments)    hallucintatons  . Latex Itching     Physical examination: Blood pressure 118/78, pulse 76, temperature 98.3 F (36.8 C), temperature source Oral, resp. rate 16, SpO2 97 %.  General: Alert, interactive, in no acute distress. HEENT: TMs pearly gray, turbinates markedly edematous and pale with clear  discharge, post-pharynx non erythematous. Neck: Supple without lymphadenopathy. Lungs: Clear to auscultation without wheezing, rhonchi or rales. {no increased work of breathing. CV: Normal S1, S2 without murmurs. Abdomen: Nondistended, nontender. Skin: Warm and dry, without lesions or rashes. Extremities:  No clubbing, cyanosis or edema. Neuro:   Grossly intact.  Diagnositics/Labs:  Spirometry: FEV1: 2.26L  84%, FVC: 3.03L  88%, after DuoNeb she had 18% increase in FEV1 to 2.67 L  or 99%  Allergy testing: Very positive to grasses, weeds, trees, mold, dust mite, cat, horse, cockroach  Allergy testing results were read and interpreted by provider, documented by clinical staff.   Assessment and plan:    Moderate persistent asthma - Continue Symbicort 160mcg  2 puffs twice a day - Albuterol 2 puffs every 4-6 hours as needed for cough with, joints of breath Asthma control goals:   Full participation in all desired activities (may need albuterol before activity)  Albuterol use two time or less a week on average (not counting use with activity)  Cough interfering with sleep two time or less a month  Oral steroids no more than once a year  No hospitalizations  Allergic rhinitis -Allergy testing is positive for grasses, weeds, trees, molds, dust mite and cat and cockroach - continue nasal saline rinse daily  - May do another course of Afrin 1 spray each nostril (use Afrin daily for no longer than 3 days).  Wait several minutes then use Flonase 2 sprays daily.  Continue Flonase daily for maximum benefit.   - Continue daily Allegra daily Singulair at night.    - We will proceed with making her immunotherapy vials and restarting her immunotherapy per our protocol. She completed consent forms today for our immunotherapy.  We'll provide with an EpiPen to bring on day of her injection.   Follow-up 4 month   I appreciate the opportunity to take part in Juanette's care. Please do not hesitate to contact me with questions.  Sincerely,   Margo AyeShaylar Padgett, MD Allergy/Immunology [facility]

## 2016-01-03 NOTE — Patient Instructions (Addendum)
-  Allergy testing is positive for grasses, weeds, trees, molds, dust mite and cat avoidance and cockroach  - continue nasal saline rinse daily  - May do another course of Afrin 1 spray each nostril (use Afrin daily for no longer than 3 days).  Wait several minutes then use Flonase 2 sprays daily.  Continue Flonase daily for maximum benefit.   - Continue daily Allegra and Singulair at night.     - Continue Symbicort 160 2 puffs twice a day - Albuterol 2 puffs every 4-6 hours as needed for cough with, joints of breath  Asthma control goals:   Full participation in all desired activities (may need albuterol before activity)  Albuterol use two time or less a week on average (not counting use with activity)  Cough interfering with sleep two time or less a month  Oral steroids no more than once a year  No hospitalizations   Follow-up 4 month

## 2016-01-21 DIAGNOSIS — J301 Allergic rhinitis due to pollen: Secondary | ICD-10-CM | POA: Diagnosis not present

## 2016-01-24 DIAGNOSIS — J3089 Other allergic rhinitis: Secondary | ICD-10-CM | POA: Diagnosis not present

## 2016-01-24 NOTE — Progress Notes (Unsigned)
Vials made 01-24-16.  jm 

## 2016-01-25 ENCOUNTER — Ambulatory Visit (INDEPENDENT_AMBULATORY_CARE_PROVIDER_SITE_OTHER): Payer: BC Managed Care – PPO | Admitting: *Deleted

## 2016-01-25 DIAGNOSIS — J301 Allergic rhinitis due to pollen: Secondary | ICD-10-CM | POA: Diagnosis not present

## 2016-01-26 NOTE — Progress Notes (Signed)
Immunotherapy   Patient Details  Name: Donna SachsRachel Case MRN: [id number] Date of Birth: 01/28/1961  01/26/2016  Donna Case started injections for  Donna Case Following schedule: B  Frequency:1 time per week Epi-Pen:Epi-Pen Available  Consent signed and patient instructions given. No problems after 30 minutes in office.    Donna RedheadHeather Case 01/26/2016, 9:44 AM

## 2016-02-01 ENCOUNTER — Ambulatory Visit (INDEPENDENT_AMBULATORY_CARE_PROVIDER_SITE_OTHER): Payer: BC Managed Care – PPO | Admitting: *Deleted

## 2016-02-01 DIAGNOSIS — J301 Allergic rhinitis due to pollen: Secondary | ICD-10-CM | POA: Diagnosis not present

## 2016-02-08 ENCOUNTER — Ambulatory Visit (INDEPENDENT_AMBULATORY_CARE_PROVIDER_SITE_OTHER): Payer: BC Managed Care – PPO | Admitting: *Deleted

## 2016-02-08 DIAGNOSIS — J301 Allergic rhinitis due to pollen: Secondary | ICD-10-CM

## 2016-02-15 ENCOUNTER — Ambulatory Visit (INDEPENDENT_AMBULATORY_CARE_PROVIDER_SITE_OTHER): Payer: BC Managed Care – PPO | Admitting: *Deleted

## 2016-02-15 DIAGNOSIS — J301 Allergic rhinitis due to pollen: Secondary | ICD-10-CM

## 2016-02-25 ENCOUNTER — Ambulatory Visit (INDEPENDENT_AMBULATORY_CARE_PROVIDER_SITE_OTHER): Payer: BC Managed Care – PPO

## 2016-02-25 DIAGNOSIS — J309 Allergic rhinitis, unspecified: Secondary | ICD-10-CM | POA: Diagnosis not present

## 2016-02-25 DIAGNOSIS — J301 Allergic rhinitis due to pollen: Secondary | ICD-10-CM

## 2016-03-02 ENCOUNTER — Ambulatory Visit (INDEPENDENT_AMBULATORY_CARE_PROVIDER_SITE_OTHER): Payer: BC Managed Care – PPO | Admitting: *Deleted

## 2016-03-02 DIAGNOSIS — J309 Allergic rhinitis, unspecified: Secondary | ICD-10-CM

## 2016-03-09 ENCOUNTER — Ambulatory Visit (INDEPENDENT_AMBULATORY_CARE_PROVIDER_SITE_OTHER): Payer: BC Managed Care – PPO

## 2016-03-09 DIAGNOSIS — J309 Allergic rhinitis, unspecified: Secondary | ICD-10-CM

## 2016-04-06 ENCOUNTER — Ambulatory Visit (INDEPENDENT_AMBULATORY_CARE_PROVIDER_SITE_OTHER): Payer: BC Managed Care – PPO

## 2016-04-06 DIAGNOSIS — J309 Allergic rhinitis, unspecified: Secondary | ICD-10-CM | POA: Diagnosis not present

## 2016-04-13 ENCOUNTER — Ambulatory Visit (INDEPENDENT_AMBULATORY_CARE_PROVIDER_SITE_OTHER): Payer: BC Managed Care – PPO

## 2016-04-13 DIAGNOSIS — J309 Allergic rhinitis, unspecified: Secondary | ICD-10-CM | POA: Diagnosis not present

## 2016-04-20 ENCOUNTER — Ambulatory Visit (INDEPENDENT_AMBULATORY_CARE_PROVIDER_SITE_OTHER): Payer: BC Managed Care – PPO

## 2016-04-20 DIAGNOSIS — J309 Allergic rhinitis, unspecified: Secondary | ICD-10-CM | POA: Diagnosis not present

## 2016-04-27 ENCOUNTER — Ambulatory Visit (INDEPENDENT_AMBULATORY_CARE_PROVIDER_SITE_OTHER): Payer: BC Managed Care – PPO

## 2016-04-27 DIAGNOSIS — J309 Allergic rhinitis, unspecified: Secondary | ICD-10-CM | POA: Diagnosis not present

## 2016-05-11 ENCOUNTER — Ambulatory Visit (INDEPENDENT_AMBULATORY_CARE_PROVIDER_SITE_OTHER): Payer: BC Managed Care – PPO

## 2016-05-11 DIAGNOSIS — J309 Allergic rhinitis, unspecified: Secondary | ICD-10-CM | POA: Diagnosis not present

## 2016-05-18 ENCOUNTER — Ambulatory Visit (INDEPENDENT_AMBULATORY_CARE_PROVIDER_SITE_OTHER): Payer: BC Managed Care – PPO | Admitting: *Deleted

## 2016-05-18 DIAGNOSIS — J309 Allergic rhinitis, unspecified: Secondary | ICD-10-CM

## 2016-06-01 ENCOUNTER — Ambulatory Visit (INDEPENDENT_AMBULATORY_CARE_PROVIDER_SITE_OTHER): Payer: BC Managed Care – PPO

## 2016-06-01 DIAGNOSIS — J301 Allergic rhinitis due to pollen: Secondary | ICD-10-CM

## 2016-06-08 ENCOUNTER — Ambulatory Visit (INDEPENDENT_AMBULATORY_CARE_PROVIDER_SITE_OTHER): Payer: BC Managed Care – PPO | Admitting: *Deleted

## 2016-06-08 DIAGNOSIS — J309 Allergic rhinitis, unspecified: Secondary | ICD-10-CM | POA: Diagnosis not present

## 2016-06-29 ENCOUNTER — Ambulatory Visit (INDEPENDENT_AMBULATORY_CARE_PROVIDER_SITE_OTHER): Payer: BC Managed Care – PPO | Admitting: *Deleted

## 2016-06-29 DIAGNOSIS — J309 Allergic rhinitis, unspecified: Secondary | ICD-10-CM | POA: Diagnosis not present

## 2016-06-30 ENCOUNTER — Emergency Department (HOSPITAL_COMMUNITY): Payer: Worker's Compensation

## 2016-06-30 ENCOUNTER — Encounter (HOSPITAL_COMMUNITY): Payer: Self-pay | Admitting: Obstetrics and Gynecology

## 2016-06-30 ENCOUNTER — Emergency Department (HOSPITAL_COMMUNITY)
Admission: EM | Admit: 2016-06-30 | Discharge: 2016-06-30 | Disposition: A | Discharge status: Home / Self Care | Payer: Worker's Compensation | Attending: Emergency Medicine | Admitting: Emergency Medicine

## 2016-06-30 DIAGNOSIS — S8991XA Unspecified injury of right lower leg, initial encounter: Secondary | ICD-10-CM | POA: Diagnosis present

## 2016-06-30 DIAGNOSIS — J45909 Unspecified asthma, uncomplicated: Secondary | ICD-10-CM | POA: Diagnosis not present

## 2016-06-30 DIAGNOSIS — Y92219 Unspecified school as the place of occurrence of the external cause: Secondary | ICD-10-CM | POA: Diagnosis not present

## 2016-06-30 DIAGNOSIS — S82141A Displaced bicondylar fracture of right tibia, initial encounter for closed fracture: Secondary | ICD-10-CM

## 2016-06-30 DIAGNOSIS — W1839XA Other fall on same level, initial encounter: Secondary | ICD-10-CM | POA: Insufficient documentation

## 2016-06-30 DIAGNOSIS — Y939 Activity, unspecified: Secondary | ICD-10-CM | POA: Insufficient documentation

## 2016-06-30 DIAGNOSIS — Z91040 Latex allergy status: Secondary | ICD-10-CM | POA: Insufficient documentation

## 2016-06-30 DIAGNOSIS — Z79899 Other long term (current) drug therapy: Secondary | ICD-10-CM | POA: Insufficient documentation

## 2016-06-30 DIAGNOSIS — Y999 Unspecified external cause status: Secondary | ICD-10-CM | POA: Diagnosis not present

## 2016-06-30 DIAGNOSIS — M25569 Pain in unspecified knee: Secondary | ICD-10-CM

## 2016-06-30 HISTORY — DX: Gastro-esophageal reflux disease without esophagitis: K21.9

## 2016-06-30 HISTORY — DX: Unspecified asthma, uncomplicated: J45.909

## 2016-06-30 LAB — I-STAT CHEM 8, ED
BUN: 8 mg/dL (ref 6–20)
CALCIUM ION: 1.15 mmol/L (ref 1.15–1.40)
Chloride: 104 mmol/L (ref 101–111)
Creatinine, Ser: 0.5 mg/dL (ref 0.44–1.00)
GLUCOSE: 86 mg/dL (ref 65–99)
HCT: 41 % (ref 36.0–46.0)
Hemoglobin: 13.9 g/dL (ref 12.0–15.0)
Potassium: 3.9 mmol/L (ref 3.5–5.1)
SODIUM: 139 mmol/L (ref 135–145)
TCO2: 28 mmol/L (ref 0–100)

## 2016-06-30 MED ORDER — IBUPROFEN 200 MG PO TABS
600.0000 mg | ORAL_TABLET | Freq: Once | ORAL | Status: AC
  Administered 2016-06-30: 600 mg via ORAL
  Filled 2016-06-30: qty 3

## 2016-06-30 MED ORDER — TRAMADOL HCL 50 MG PO TABS: 50.0000 mg | ORAL_TABLET | Freq: Four times a day (QID) | ORAL | 0 refills | Status: DC | PRN

## 2016-06-30 NOTE — Discharge Instructions (Signed)
Your ankle x-rays were negative.   Your knee x-ray was inconclusive and we did a CT scan.  Your CT scan of your knee showed a minimally depressed and mildly comminuted fracture of the posterolateral tibial plateau involving the joint. At this time we will treat your injury with a knee immobilizer, crutches and NO WEIGHT BARING until you are evaluated by the orthopedist.   I was unable to contact the orthopedist on call today.  But please contact Dr. Marlene Bast office to make an appointment as soon as possible to make an appointment for re-evaluation within 7 days.   For mild pain you may take ibuprofen 600 mg every 6-8 hours.  For moderate to severe pain you may take both ibuprofen 600 mg + tramadol 50 mg as prescribed. Ice. Rest. Elevate your extremity to decrease inflammation.   Return to the emergency department if pain is uncontrollable, you have numbness or weakness to your lowe extremity.
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
## 2016-06-30 NOTE — ED Notes (Signed)
Patient transported to X-ray 

## 2016-06-30 NOTE — ED Notes (Signed)
Patient transported to CT 
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
## 2016-06-30 NOTE — ED Notes (Signed)
ED Provider at bedside. 

## 2016-07-03 ENCOUNTER — Ambulatory Visit (INDEPENDENT_AMBULATORY_CARE_PROVIDER_SITE_OTHER): Payer: Worker's Compensation | Admitting: Orthopaedic Surgery

## 2016-07-03 ENCOUNTER — Encounter (INDEPENDENT_AMBULATORY_CARE_PROVIDER_SITE_OTHER): Payer: Self-pay | Admitting: Orthopaedic Surgery

## 2016-07-03 VITALS — BP 125/70 | HR 73 | Ht 73.5 in | Wt 192.0 lb

## 2016-07-03 DIAGNOSIS — S82141A Displaced bicondylar fracture of right tibia, initial encounter for closed fracture: Secondary | ICD-10-CM | POA: Diagnosis not present

## 2016-07-03 MED ORDER — TRAMADOL HCL 50 MG PO TABS: 50.0000 mg | ORAL_TABLET | Freq: Four times a day (QID) | ORAL | 0 refills | Status: DC | PRN
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
## 2016-07-06 ENCOUNTER — Telehealth (INDEPENDENT_AMBULATORY_CARE_PROVIDER_SITE_OTHER): Payer: Self-pay

## 2016-07-06 NOTE — Telephone Encounter (Signed)
?  OK TO REFILL °

## 2016-07-06 NOTE — Telephone Encounter (Signed)
Ok for refill? 

## 2016-07-06 NOTE — Telephone Encounter (Signed)
Patient would like a Rx refill on Ibuprofen.  CB# is [phone]

## 2016-07-07 MED ORDER — IBUPROFEN 800 MG PO TABS: 800.0000 mg | ORAL_TABLET | Freq: Every day | ORAL | 0 refills | Status: DC | PRN

## 2016-07-07 NOTE — Addendum Note (Signed)
Addended by: Rogers SeedsYEATTS, Cashis Rill M on: 07/07/2016 12:38 PM   Modules accepted: Orders

## 2016-07-07 NOTE — Telephone Encounter (Signed)
I entered into system. Patient [facility] RX [facility] [address]. I changed pharmacy preference in the computer. Patient aware.

## 2016-07-17 ENCOUNTER — Ambulatory Visit (INDEPENDENT_AMBULATORY_CARE_PROVIDER_SITE_OTHER): Payer: BC Managed Care – PPO

## 2016-07-17 DIAGNOSIS — J309 Allergic rhinitis, unspecified: Secondary | ICD-10-CM | POA: Diagnosis not present

## 2016-07-18 ENCOUNTER — Encounter (INDEPENDENT_AMBULATORY_CARE_PROVIDER_SITE_OTHER): Payer: Self-pay | Admitting: Orthopaedic Surgery

## 2016-07-18 ENCOUNTER — Ambulatory Visit (INDEPENDENT_AMBULATORY_CARE_PROVIDER_SITE_OTHER): Payer: Self-pay

## 2016-07-18 ENCOUNTER — Ambulatory Visit (INDEPENDENT_AMBULATORY_CARE_PROVIDER_SITE_OTHER): Payer: Worker's Compensation | Admitting: Orthopaedic Surgery

## 2016-07-18 VITALS — BP 129/78 | HR 84 | Ht 73.5 in | Wt 192.0 lb

## 2016-07-18 DIAGNOSIS — S82141D Displaced bicondylar fracture of right tibia, subsequent encounter for closed fracture with routine healing: Secondary | ICD-10-CM | POA: Diagnosis not present

## 2016-07-18 NOTE — Progress Notes (Signed)
Office Visit Note   Patient: Donna SachsRachel Sime           Date of Birth: 07/02/1960           MRN: [id number] Visit Date: 07/18/2016              Requested by: Willow OraAndy, Camille L, MD [address] [place], [place] [address] PCP: Willow OraAndy, Camille L, MD   Assessment & Plan: Visit Diagnoses:  1. Closed fracture of right tibial plateau with routine healing, subsequent encounter     Plan: We will give her a note so that she can resume work activity sit down work with her crutches and knee immobilizer. She'll left take off her knee immobilizer to drive. She'll apply when she gets out of a car. Knee immobilizer was applied today. Patient can reapply her Bledsoe brace at home which is locked at 45. She can remove it as she has been doing for bathing her leg and work on gentle knee flexion and knee extension as well as straight leg raising. I will recheck her again in 4 weeks. Repeat x-rays which should be her final set AP and lateral on return. I discussed with patient she needs to use 2 crutches instead of 1. We discussed outlined plan with the rehabilitation nurse care coordinator is here with her today as well. I will recheck her again and 4 weeks  Follow-Up Instructions: No Follow-up on file.   Orders:  Orders Placed This Encounter  Procedures  . XR Knee 1-2 Views Right   No orders of the defined types were placed in this encounter.     Procedures: No procedures performed   Clinical Data: No additional findings.   Subjective: Chief Complaint  Patient presents with  . Right Knee - Fracture, Follow-up    HPI patient returns for on-the-job injury to her right knee with the tibial plateau fracture. Initial x-rays were negative showed acute hemarthrosis and CT scan showed the minimally displaced lateral tibial plateau that just involve the posterior aspect. She's been in a Bledsoe brace. She remains with a hemarthrosis. She has been assigned a case manager Kallie LocksAnissa Carroll, RN,  CCM who is with her today. Her fax is [phone] and cell phone [phone]  Review of Systems 14 point review systems updated from last office visit and is unchanged. She's been ambulatory with a single crutch and has been touchdown or more accurately partial weightbearing on her toe.  Objective: Vital Signs: BP 129/78   Pulse 84   Ht 6' 1.5" (1.867 m)   Wt 192 lb (87.1 kg)   LMP 07/13/2012   BMI 24.99 kg/m   Physical Exam  Constitutional: She is oriented to person, place, and time. She appears well-developed.  HENT:  Head: Normocephalic.  Right Ear: External ear normal.  Left Ear: External ear normal.  Eyes: Pupils are equal, round, and reactive to light.  Neck: No tracheal deviation present. No thyromegaly present.  Cardiovascular: Normal rate.   Pulmonary/Chest: Effort normal.  Abdominal: Soft.  Musculoskeletal:  Patient has 2+ knee hemarthrosis. Collateral ligaments are stable. Negative Lockman. Ankle range of motion is normal negative Homan test no cellulitis pulses are normal.  Neurological: She is alert and oriented to person, place, and time.  Skin: Skin is warm and dry.  Psychiatric: She has a normal mood and affect. Her behavior is normal.    Ortho Exam  Specialty Comments:  No specialty comments available.  Imaging: Xr Knee 1-2 Views Right  Result Date: 07/18/2016 AP and lateral right knee x-rays are obtained. Fracture is best seen on lateral x-ray and only involves the posterior aspect the lateral condyle. There is some sclerotic healing no change in displacement. Impression: Lateral tibial plateau fracture nondisplaced interval callus formation.    PMFS History: Patient Active Problem List   Diagnosis Date Noted  . Asthma 11/14/2014  . Allergic rhinitis 11/14/2014   Past Medical History:  Diagnosis Date  . Asthma   . Asthma due to environmental allergies   . GERD (gastroesophageal reflux disease)     No family history on file.  No past surgical  history on file. Social History   Occupational History  . Not on file.   Social History Main Topics  . Smoking status: Never Smoker  . Smokeless tobacco: Never Used  . Alcohol use Yes  . Drug use: No  . Sexual activity: Not on file

## 2016-07-25 ENCOUNTER — Telehealth (INDEPENDENT_AMBULATORY_CARE_PROVIDER_SITE_OTHER): Payer: Self-pay

## 2016-07-25 NOTE — Telephone Encounter (Signed)
Faxed the 07/18/16 office note to [facility] case mgr per her request.

## 2016-07-27 ENCOUNTER — Telehealth (INDEPENDENT_AMBULATORY_CARE_PROVIDER_SITE_OTHER): Payer: Self-pay

## 2016-07-27 NOTE — Telephone Encounter (Signed)
Yes- OK to do  Thank you

## 2016-07-27 NOTE — Telephone Encounter (Signed)
Ok for note 

## 2016-07-27 NOTE — Telephone Encounter (Signed)
Patient stated that she went back to work on Monday and left early on Wednesday due to having back and knee pain.  Stated she may have went back to work to soon.  Would like to have a work note to cover those days and the rest of this week?  Please advise.  Thank You.  CB# is [phone].

## 2016-07-28 ENCOUNTER — Telehealth (INDEPENDENT_AMBULATORY_CARE_PROVIDER_SITE_OTHER): Payer: Self-pay | Admitting: Orthopaedic Surgery

## 2016-07-28 ENCOUNTER — Other Ambulatory Visit (INDEPENDENT_AMBULATORY_CARE_PROVIDER_SITE_OTHER): Payer: Self-pay | Admitting: Family

## 2016-07-28 MED ORDER — TRAMADOL HCL 50 MG PO TABS: 50.0000 mg | ORAL_TABLET | Freq: Two times a day (BID) | ORAL | 0 refills | Status: DC | PRN

## 2016-07-28 MED ORDER — METHOCARBAMOL 500 MG PO TABS: 500.0000 mg | ORAL_TABLET | Freq: Three times a day (TID) | ORAL | 0 refills | Status: DC

## 2016-07-28 NOTE — Telephone Encounter (Signed)
Could you please advise since Dr. Ophelia CharterYates is out of the office? She received Tramadol 1 po q 6 prn #15 on 07/03/2016. Thanks.

## 2016-07-28 NOTE — Telephone Encounter (Signed)
Tramadol called to pharmacy. Erin e-prescribed the robaxin. Patient advised.

## 2016-07-28 NOTE — Telephone Encounter (Signed)
Patient returned call asked for a call back as soon as possible. The number to contact patient is [phone]

## 2016-07-28 NOTE — Telephone Encounter (Signed)
Fax number is incorrect. I called patient. She asked me to look up [employer]. I called to get fax. [phone]. Note faxed.

## 2016-07-28 NOTE — Telephone Encounter (Signed)
Patient states that at last visit, Dr. Ophelia CharterYates offered muscle relaxer but she did not want at that time. She would like to know if she could try it now.  She also needs refill on Tramadol.

## 2016-07-28 NOTE — Telephone Encounter (Signed)
I spoke with patient. She requests note out of work 07/26/2016-07/28/2016. Fax to [phone] attn: Gwenyth BouillonMohammad

## 2016-08-01 ENCOUNTER — Telehealth (INDEPENDENT_AMBULATORY_CARE_PROVIDER_SITE_OTHER): Payer: Self-pay

## 2016-08-01 ENCOUNTER — Telehealth (INDEPENDENT_AMBULATORY_CARE_PROVIDER_SITE_OTHER): Payer: Self-pay | Admitting: Orthopaedic Surgery

## 2016-08-01 NOTE — Telephone Encounter (Signed)
Patient was calling again concerning a letter that she had received from her benefits.  Would like a call back.  CB # [phone]

## 2016-08-01 NOTE — Telephone Encounter (Signed)
Ok to do note. thanks

## 2016-08-01 NOTE — Telephone Encounter (Signed)
I spoke with patient. She is requesting a work note that states that she can only work 1/2 days until the end of June. 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 This is bothering her knee, as well as other parts of her body trying to get around on crutches. She feels that she would be able to do 1/2 days with no problem.  Please advise on the note. Patient states that you can call her if you would like for her to better explain her situation.

## 2016-08-01 NOTE — Telephone Encounter (Signed)
Patient received letter from HR and was needing to speak with you about confirming some things. CB # [phone]

## 2016-08-02 ENCOUNTER — Telehealth (INDEPENDENT_AMBULATORY_CARE_PROVIDER_SITE_OTHER): Payer: Self-pay

## 2016-08-02 NOTE — Telephone Encounter (Signed)
Patient returned Betsy's call, advised her of message concerning work note per Dr. Ophelia CharterYates.  Patient would like for note to be faxed to [phone], attention Larwance Sachsachel Dunleavy.  Thank You.  CB# is [phone].

## 2016-08-02 NOTE — Telephone Encounter (Signed)
Note completed. Awaiting patient return call to see if she wants note faxed.

## 2016-08-02 NOTE — Telephone Encounter (Signed)
Note faxed.

## 2016-08-03 ENCOUNTER — Ambulatory Visit (INDEPENDENT_AMBULATORY_CARE_PROVIDER_SITE_OTHER): Payer: BC Managed Care – PPO | Admitting: *Deleted

## 2016-08-03 DIAGNOSIS — J309 Allergic rhinitis, unspecified: Secondary | ICD-10-CM

## 2016-08-15 ENCOUNTER — Ambulatory Visit (INDEPENDENT_AMBULATORY_CARE_PROVIDER_SITE_OTHER): Payer: Self-pay

## 2016-08-15 ENCOUNTER — Ambulatory Visit (INDEPENDENT_AMBULATORY_CARE_PROVIDER_SITE_OTHER): Payer: Worker's Compensation | Admitting: Orthopaedic Surgery

## 2016-08-15 ENCOUNTER — Encounter (INDEPENDENT_AMBULATORY_CARE_PROVIDER_SITE_OTHER): Payer: Self-pay | Admitting: Orthopaedic Surgery

## 2016-08-15 DIAGNOSIS — S82141D Displaced bicondylar fracture of right tibia, subsequent encounter for closed fracture with routine healing: Secondary | ICD-10-CM | POA: Diagnosis not present

## 2016-08-15 NOTE — Progress Notes (Signed)
Office Visit Note   Patient: Donna SachsRachel Bacot           Date of Birth: 07/31/1960           MRN: [id number] Visit Date: 08/15/2016              Requested by: Willow OraAndy, Camille L, MD [address] [place], [place] [address] PCP: Willow OraAndy, Camille L, MD   Assessment & Plan: Visit Diagnoses:  1. Closed fracture of right tibial plateau, with routine healing, subsequent encounter     Plan: Knee sleeve,. Bledsoe brace. Weightbearing as tolerated. Work slip given for work resumption today. Quad strengthening exercises discussed. She did straight leg raising with her personal is a counterweight over her ankle. She has flexion to 110 today with only trace knee effusion. I plan to recheck her in one month for assignment of impairment rating for her tibial plateau fracture. I do not think she needs formal physical therapy at this point which I discussed with the rehabilitation nurse and also the patient. Patient will call me if she has any problems.  Follow-Up Instructions: Return in about 1 month (around 09/14/2016).   Orders:  Orders Placed This Encounter  Procedures  . XR Knee 1-2 Views Right   No orders of the defined types were placed in this encounter.     Procedures: No procedures performed   Clinical Data: No additional findings.   Subjective: Chief Complaint  Patient presents with  . Right Knee - Fracture, Follow-up    HPI 56 year old female returns for follow-up of right posterior tibial plateau fracture treated nonoperatively. X-rays show sclerotic changes. Cortical gap is barely visible at this point. Swelling is down in her knee she's been using the Bledsoe brace she can walk without her crutches in the brace today. She has some knee stiffness but can flex to 90. She's not taking any pain medication states she is ready to resume work activities. She is here with the rehabilitation nurse Kallie LocksAnissa Carroll RN, CCM fax number [phone]  Review of Systems 14 point  review of systems updated unchanged from 07/18/2016 office visit other than as mentioned in history of present illness. She's been immature in her house with the brace on and not using her crutches.   Objective: Vital Signs: BP 128/72   Pulse 85   Ht 6' 1.5" (1.867 m)   Wt 192 lb (87.1 kg)   LMP 07/13/2012   BMI 24.99 kg/m   Physical Exam  Constitutional: She is oriented to person, place, and time. She appears well-developed.  HENT:  Head: Normocephalic.  Right Ear: External ear normal.  Left Ear: External ear normal.  Eyes: Pupils are equal, round, and reactive to light.  Neck: No tracheal deviation present. No thyromegaly present.  Cardiovascular: Normal rate.   Pulmonary/Chest: Effort normal.  Abdominal: Soft.  Musculoskeletal:  No pain with hip range of motion. Collateral ligaments are stable. She has some crepitus with knee extension on the involved right knee than left knee which is patellofemoral. She can do a straight leg raise extension lag. Quad strength is good. Negative PCL laxity. No swelling of the ankle. Distal pulses are intact normal ankle dorsiflexion plantar flexion. She can ambulate in the office back and forth across exam or without a limp. She demonstrated straight leg raising with purse strap which is heavy across her ankle. Good quad strength in the involved leg.  Neurological: She is alert and oriented to person, place, and time.  Skin:  Skin is warm and dry.  Psychiatric: She has a normal mood and affect. Her behavior is normal.    Ortho Exam  Specialty Comments:  No specialty comments available.  Imaging: No results found.   PMFS History: Patient Active Problem List   Diagnosis Date Noted  . Closed fracture of right tibial plateau, with routine healing, subsequent encounter 08/15/2016  . Asthma 11/14/2014  . Allergic rhinitis 11/14/2014   Past Medical History:  Diagnosis Date  . Asthma   . Asthma due to environmental allergies   . GERD  (gastroesophageal reflux disease)     No family history on file.  No past surgical history on file. Social History   Occupational History  . Not on file.   Social History Main Topics  . Smoking status: Never Smoker  . Smokeless tobacco: Never Used  . Alcohol use Yes  . Drug use: No  . Sexual activity: Not on file

## 2016-08-17 ENCOUNTER — Ambulatory Visit (INDEPENDENT_AMBULATORY_CARE_PROVIDER_SITE_OTHER): Payer: BC Managed Care – PPO | Admitting: *Deleted

## 2016-08-17 DIAGNOSIS — J309 Allergic rhinitis, unspecified: Secondary | ICD-10-CM

## 2016-08-28 ENCOUNTER — Ambulatory Visit (INDEPENDENT_AMBULATORY_CARE_PROVIDER_SITE_OTHER): Payer: BC Managed Care – PPO | Admitting: *Deleted

## 2016-08-28 DIAGNOSIS — J309 Allergic rhinitis, unspecified: Secondary | ICD-10-CM | POA: Diagnosis not present

## 2016-09-01 ENCOUNTER — Ambulatory Visit (INDEPENDENT_AMBULATORY_CARE_PROVIDER_SITE_OTHER): Payer: Self-pay

## 2016-09-01 ENCOUNTER — Ambulatory Visit (INDEPENDENT_AMBULATORY_CARE_PROVIDER_SITE_OTHER): Payer: Worker's Compensation | Admitting: Orthopaedic Surgery

## 2016-09-01 DIAGNOSIS — M25561 Pain in right knee: Secondary | ICD-10-CM | POA: Diagnosis not present

## 2016-09-01 DIAGNOSIS — S82141A Displaced bicondylar fracture of right tibia, initial encounter for closed fracture: Secondary | ICD-10-CM | POA: Diagnosis not present
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
## 2016-09-05 ENCOUNTER — Ambulatory Visit (INDEPENDENT_AMBULATORY_CARE_PROVIDER_SITE_OTHER): Payer: BC Managed Care – PPO

## 2016-09-05 DIAGNOSIS — J309 Allergic rhinitis, unspecified: Secondary | ICD-10-CM | POA: Diagnosis not present

## 2016-09-22 ENCOUNTER — Ambulatory Visit (INDEPENDENT_AMBULATORY_CARE_PROVIDER_SITE_OTHER): Payer: BC Managed Care – PPO

## 2016-09-22 DIAGNOSIS — J309 Allergic rhinitis, unspecified: Secondary | ICD-10-CM | POA: Diagnosis not present

## 2016-09-26 ENCOUNTER — Ambulatory Visit (INDEPENDENT_AMBULATORY_CARE_PROVIDER_SITE_OTHER): Payer: Worker's Compensation | Admitting: Orthopaedic Surgery

## 2016-09-26 ENCOUNTER — Encounter (INDEPENDENT_AMBULATORY_CARE_PROVIDER_SITE_OTHER): Payer: Self-pay | Admitting: Orthopaedic Surgery

## 2016-09-26 VITALS — BP 131/73 | HR 76 | Ht 73.5 in | Wt 192.0 lb

## 2016-09-26 DIAGNOSIS — S82141D Displaced bicondylar fracture of right tibia, subsequent encounter for closed fracture with routine healing: Secondary | ICD-10-CM

## 2016-09-26 NOTE — Progress Notes (Signed)
   Office Visit Note   Patient: Donna Case           Date of Birth: 04/07/1960           MRN: [id number] Visit Date: 09/26/2016              Requested by: Willow OraAndy, Camille L, MD [address] [place], [place] [address] PCP: Willow OraAndy, Camille L, MD   Assessment & Plan: Visit Diagnoses: Healed right tibial plateau fracture secondary to on-the-job injury.  Plan: Based on the on-the-job injury that occurred on 06/30/2016 with a tibial plateau fracture and fracture extending intra-articular with the potential for possible late posttraumatic arthritis.  Rating  is 10% of the right leg. [facility] office follow-up when necessary. She was alert he given a note releasing for work and there are no work restrictions. No new note was given sense of artery released her for regular work.  Follow-Up Instructions: No Follow-up on file.   Orders:  No orders of the defined types were placed in this encounter.  No orders of the defined types were placed in this encounter.     Procedures: No procedures performed   Clinical Data: No additional findings.   Subjective: Chief Complaint  Patient presents with  . Right Knee - Follow-up    HPI she returns she is here with her medical case manager Ms. Carroll fax #[phone]. She is back to satisfactory activity and is ready to be released and rotated.  Review of Systems unchanged from last office visit   Objective: Vital Signs: BP 131/73   Pulse 76   Ht 6' 1.5" (1.867 m)   Wt 192 lb (87.1 kg)   LMP 07/13/2012   BMI 24.99 kg/m   Physical Exam  Constitutional: She is oriented to person, place, and time. She appears well-developed.  HENT:  Head: Normocephalic.  Right Ear: External ear normal.  Left Ear: External ear normal.  Eyes: Pupils are equal, round, and reactive to light.  Neck: No tracheal deviation present. No thyromegaly present.  Cardiovascular: Normal rate.   Pulmonary/Chest: Effort normal.  Abdominal:  Soft.  Musculoskeletal:  Patient is good knee range of motion there is no effusion noted. No pitting edema no swelling normal hip range of motion.  Neurological: She is alert and oriented to person, place, and time.  Skin: Skin is warm and dry.  Psychiatric: She has a normal mood and affect. Her behavior is normal.    Ortho Exam  Specialty Comments:  No specialty comments available.  Imaging: No results found.   PMFS History: Patient Active Problem List   Diagnosis Date Noted  . Closed fracture of right tibial plateau, with routine healing, subsequent encounter 08/15/2016  . Asthma 11/14/2014  . Allergic rhinitis 11/14/2014   Past Medical History:  Diagnosis Date  . Asthma   . Asthma due to environmental allergies   . GERD (gastroesophageal reflux disease)     No family history on file.  No past surgical history on file. Social History   Occupational History  . Not on file.   Social History Main Topics  . Smoking status: Never Smoker  . Smokeless tobacco: Never Used  . Alcohol use Yes  . Drug use: No  . Sexual activity: Not on file

## 2016-09-29 ENCOUNTER — Ambulatory Visit (INDEPENDENT_AMBULATORY_CARE_PROVIDER_SITE_OTHER): Payer: BC Managed Care – PPO

## 2016-09-29 DIAGNOSIS — J309 Allergic rhinitis, unspecified: Secondary | ICD-10-CM

## 2016-10-02 ENCOUNTER — Encounter (INDEPENDENT_AMBULATORY_CARE_PROVIDER_SITE_OTHER): Payer: Self-pay | Admitting: Orthopaedic Surgery

## 2016-10-23 ENCOUNTER — Ambulatory Visit (INDEPENDENT_AMBULATORY_CARE_PROVIDER_SITE_OTHER): Payer: BC Managed Care – PPO | Admitting: *Deleted

## 2016-10-23 DIAGNOSIS — J309 Allergic rhinitis, unspecified: Secondary | ICD-10-CM | POA: Diagnosis not present

## 2016-10-25 ENCOUNTER — Other Ambulatory Visit: Payer: Self-pay | Admitting: Allergy and Immunology

## 2016-10-27 ENCOUNTER — Ambulatory Visit (INDEPENDENT_AMBULATORY_CARE_PROVIDER_SITE_OTHER): Payer: BC Managed Care – PPO

## 2016-10-27 DIAGNOSIS — J309 Allergic rhinitis, unspecified: Secondary | ICD-10-CM | POA: Diagnosis not present

## 2016-11-02 ENCOUNTER — Other Ambulatory Visit (INDEPENDENT_AMBULATORY_CARE_PROVIDER_SITE_OTHER): Payer: Self-pay | Admitting: Orthopaedic Surgery

## 2016-11-03 ENCOUNTER — Ambulatory Visit (INDEPENDENT_AMBULATORY_CARE_PROVIDER_SITE_OTHER): Payer: BC Managed Care – PPO

## 2016-11-03 ENCOUNTER — Telehealth (INDEPENDENT_AMBULATORY_CARE_PROVIDER_SITE_OTHER): Payer: Self-pay

## 2016-11-03 DIAGNOSIS — J309 Allergic rhinitis, unspecified: Secondary | ICD-10-CM

## 2016-11-03 NOTE — Telephone Encounter (Signed)
Faxed 09/26/16 office note to the [facility] per his request Fax#[phone]

## 2016-11-07 MED ORDER — IBUPROFEN 800 MG PO TABS: 800.0000 mg | ORAL_TABLET | Freq: Every day | ORAL | 0 refills | Status: DC | PRN

## 2016-11-07 NOTE — Telephone Encounter (Signed)
yes

## 2016-11-15 ENCOUNTER — Ambulatory Visit (INDEPENDENT_AMBULATORY_CARE_PROVIDER_SITE_OTHER): Payer: BC Managed Care – PPO | Admitting: *Deleted

## 2016-11-15 ENCOUNTER — Other Ambulatory Visit: Payer: Self-pay | Admitting: Allergy

## 2016-11-15 DIAGNOSIS — J309 Allergic rhinitis, unspecified: Secondary | ICD-10-CM | POA: Diagnosis not present

## 2016-11-15 MED ORDER — FLUTICASONE PROPIONATE 50 MCG/ACT NA SUSP: 2.0000 | Freq: Every day | NASAL | 0 refills | Status: DC

## 2016-11-15 NOTE — Telephone Encounter (Signed)
Patient stopped in and requested refills for Symbicort and Flonase. [facility]. Last seen 01-03-16. Let her know she might need to make an appointment. She did not make one.

## 2016-11-15 NOTE — Telephone Encounter (Signed)
symbicort 160 sample given and fluticasone rx sent in advised pt she needs ov for further refills has not been seen since 01/2016. appt made to see Dr Nunzio CobbsBobbitt

## 2016-11-24 DIAGNOSIS — J301 Allergic rhinitis due to pollen: Secondary | ICD-10-CM | POA: Diagnosis not present

## 2016-11-27 ENCOUNTER — Ambulatory Visit (INDEPENDENT_AMBULATORY_CARE_PROVIDER_SITE_OTHER): Payer: BC Managed Care – PPO | Admitting: Allergy and Immunology

## 2016-11-27 ENCOUNTER — Encounter: Payer: Self-pay | Admitting: Allergy and Immunology

## 2016-11-27 VITALS — BP 124/78 | HR 72 | Temp 98.3°F | Resp 12

## 2016-11-27 DIAGNOSIS — J4540 Moderate persistent asthma, uncomplicated: Secondary | ICD-10-CM

## 2016-11-27 DIAGNOSIS — R430 Anosmia: Secondary | ICD-10-CM | POA: Diagnosis not present

## 2016-11-27 DIAGNOSIS — J309 Allergic rhinitis, unspecified: Secondary | ICD-10-CM

## 2016-11-27 DIAGNOSIS — J3089 Other allergic rhinitis: Secondary | ICD-10-CM

## 2016-11-27 MED ORDER — ALBUTEROL SULFATE HFA 108 (90 BASE) MCG/ACT IN AERS: 2.0000 | INHALATION_SPRAY | Freq: Four times a day (QID) | RESPIRATORY_TRACT | 1 refills | Status: DC | PRN

## 2016-11-27 MED ORDER — FLUTICASONE PROPIONATE 93 MCG/ACT NA EXHU: 2.0000 | INHALANT_SUSPENSION | Freq: Two times a day (BID) | NASAL | 4 refills | Status: DC | PRN

## 2016-11-27 MED ORDER — SYMBICORT 160-4.5 MCG/ACT IN AERO
2.0000 | INHALATION_SPRAY | Freq: Two times a day (BID) | RESPIRATORY_TRACT | 1 refills | Status: DC
Start: 2016-11-27 — End: 2017-09-27

## 2016-11-27 MED ORDER — MONTELUKAST SODIUM 10 MG PO TABS: 10.0000 mg | ORAL_TABLET | Freq: Every day | ORAL | 1 refills | Status: DC

## 2016-11-27 NOTE — Assessment & Plan Note (Signed)
Currently with suboptimal control.  Continue appropriate allergen avoidance measures and montelukast 10 mg daily.  A prescription has been provided for [facility], 2 actuations per nostril twice a day. Proper technique has been discussed and demonstrated.  Aeroallergen immunotherapy vials will be remixed with the inclusion of dog epithelia.

## 2016-11-27 NOTE — Assessment & Plan Note (Signed)
Severe congestion secondary to allergic rhinitis versus nasal polyps.  A prescription has been provided for [facility] (as above).

## 2016-11-27 NOTE — Progress Notes (Signed)
Follow-up Note  RE: Donna Case MRN: [id number] DOB: 12-13-1960 Date of Office Visit: 11/27/2016  Primary care provider: Willow Ora, MD Referring provider: Willow Ora, MD  History of present illness: Donna Case is a 56 y.o. female with persistent asthma and allergic rhinitis on immunotherapy presenting today for follow up.  She was last seen in this clinic in October 2017 by Dr. Delorse Lek.  She currently takes Symbicort 160/4.5 g, 2 inhalations twice a day.  She admits that she has not been using a spacer device.  With this treatment plan she has been requiring albuterol rescue 3 times per week on average, typically in the early morning and late afternoon.  She reports that her nose is "always stuffy, and also she notes a decreased sense of smell.  The symptoms occur year round without significant seasonal variation.  She does note that she experiences both upper and lower respiratory symptoms when she is around dogs.  She is a bit frustrated today because dog antigen was not put into her vials which were remixed and October 2017.  Apparently, her skin tests were negative to dog, however she believes that that particular intradermal test may have been performed incorrectly.  She reports that she was positive to dog epithelia on her previous testing and had had dog epithelia in her previous set of vitals and, given her pronounced symptoms when she is around dogs, would like for this antigen to be put into her current set of vials.   Assessment and plan: Moderate persistent asthma  Continue Symbicort 160/4.5 g, 2 inhalations twice a day.  To maximize pulmonary deposition, a spacer has been provided along with instructions for its proper administration with an HFA inhaler.  If adding a spacer does not decrease her albuterol requirement to fewer than 2 times per week on average, we will step up therapy.  Continue albuterol every 4-6 hours as needed.  Subjective and objective  measures of pulmonary function will be followed and the treatment plan will be adjusted accordingly.  Allergic rhinitis Currently with suboptimal control.  Continue appropriate allergen avoidance measures and montelukast 10 mg daily.  A prescription has been provided for [facility], 2 actuations per nostril twice a day. Proper technique has been discussed and demonstrated.  Aeroallergen immunotherapy vials will be remixed with the inclusion of dog epithelia.  Anosmia Severe congestion secondary to allergic rhinitis versus nasal polyps.  A prescription has been provided for [facility] (as above).   Meds ordered this encounter  Medications  . montelukast (SINGULAIR) 10 MG tablet    Sig: Take 1 tablet (10 mg total) by mouth at bedtime. Reported on 07/05/2015    Dispense:  90 tablet    Refill:  1  . albuterol (PROVENTIL HFA;VENTOLIN HFA) 108 (90 Base) MCG/ACT inhaler    Sig: Inhale 2 puffs into the lungs every 6 (six) hours as needed for wheezing.    Dispense:  3 Inhaler    Refill:  1  . SYMBICORT 160-4.5 MCG/ACT inhaler    Sig: Inhale 2 puffs into the lungs 2 (two) times daily.    Dispense:  3 Inhaler    Refill:  1  . Fluticasone Propionate (XHANCE) 93 MCG/ACT EXHU    Sig: Place 2 sprays into both nostrils 2 (two) times daily as needed.    Dispense:  1 mL    Refill:  4    Diagnostics: Spirometry:  Normal with an FEV1 of 95% predicted.  Please see scanned spirometry results  for details.    Physical examination: Blood pressure 124/78, pulse 72, temperature 98.3 F (36.8 C), temperature source Oral, resp. rate 12, last menstrual period 07/13/2012, SpO2 98 %.  General: Alert, interactive, in no acute distress. HEENT: TMs pearly gray, turbinates edematous with clear discharge, post-pharynx mildly erythematous. Neck: Supple without lymphadenopathy. Lungs: Clear to auscultation without wheezing, rhonchi or rales. CV: Normal S1, S2 without murmurs. Skin: Warm and dry, without lesions or  rashes.  The following portions of the patient's history were reviewed and updated as appropriate: allergies, current medications, past family history, past medical history, past social history, past surgical history and problem list.  Allergies as of 11/27/2016      Reactions   Codeine Other (See Comments)   hallucinations   Hydrocodone Other (See Comments)   hallucintatons   Latex Itching      Medication List       Accurate as of 11/27/16  2:32 PM. Always use your most recent med list.          albuterol 108 (90 Base) MCG/ACT inhaler Commonly known as:  PROVENTIL HFA;VENTOLIN HFA Inhale 2 puffs into the lungs every 6 (six) hours as needed for wheezing.   fexofenadine 180 MG tablet Commonly known as:  ALLEGRA take 1 tablet by mouth at bedtime for allergies   Fluticasone Propionate 93 MCG/ACT Exhu Commonly known as:  XHANCE Place 2 sprays into both nostrils 2 (two) times daily as needed.   ibuprofen 800 MG tablet Commonly known as:  ADVIL,MOTRIN Take 1 tablet (800 mg total) by mouth daily as needed.   mometasone 50 MCG/ACT nasal spray Commonly known as:  NASONEX Place 2 sprays into the nose 2 (two) times daily.   montelukast 10 MG tablet Commonly known as:  SINGULAIR Take 1 tablet (10 mg total) by mouth at bedtime. Reported on 07/05/2015   omeprazole 20 MG capsule Commonly known as:  PRILOSEC Take 20 mg by mouth daily.   SYMBICORT 160-4.5 MCG/ACT inhaler Generic drug:  budesonide-formoterol Inhale 2 puffs into the lungs 2 (two) times daily.   valACYclovir 1000 MG tablet Commonly known as:  VALTREX Take 1 tablet by mouth daily as needed for mouth pain.            Discharge Care Instructions        Start     Ordered   11/27/16 0000  Spirometry with Graph    Question Answer Comment  Where should this test be performed? Other   Basic spirometry Yes   Spirometry pre & post bronchodilator No      11/27/16 1245   11/27/16 0000  montelukast (SINGULAIR) 10  MG tablet  Daily at bedtime     11/27/16 1245   11/27/16 0000  albuterol (PROVENTIL HFA;VENTOLIN HFA) 108 (90 Base) MCG/ACT inhaler  Every 6 hours PRN     11/27/16 1245   11/27/16 0000  SYMBICORT 160-4.5 MCG/ACT inhaler  2 times daily     11/27/16 1245   11/27/16 0000  Fluticasone Propionate (XHANCE) 93 MCG/ACT EXHU  2 times daily PRN     11/27/16 1245      Allergies  Allergen Reactions  . Codeine Other (See Comments)    hallucinations  . Hydrocodone Other (See Comments)    hallucintatons  . Latex Itching   Review of systems: Review of systems negative except as noted in HPI / PMHx or noted below: Constitutional: Negative.  HENT: Negative.   Eyes: Negative.  Respiratory: Negative.   Cardiovascular:  Negative.  Gastrointestinal: Negative.  Genitourinary: Negative.  Musculoskeletal: Negative.  Neurological: Negative.  Endo/Heme/Allergies: Negative.  Cutaneous: Negative.  Past Medical History:  Diagnosis Date  . Asthma   . Asthma due to environmental allergies   . GERD (gastroesophageal reflux disease)     No family history on file.  Social History   Social History  . Marital status: Unknown    Spouse name: N/A  . Number of children: N/A  . Years of education: N/A   Occupational History  . Not on file.   Social History Main Topics  . Smoking status: Never Smoker  . Smokeless tobacco: Never Used  . Alcohol use Yes  . Drug use: No  . Sexual activity: Not on file   Other Topics Concern  . Not on file   Social History Narrative  . No narrative on file    I appreciate the opportunity to take part in Donna Case's care. Please do not hesitate to contact me with questions.  Sincerely,   R. Jorene Guest, MD

## 2016-11-27 NOTE — Patient Instructions (Signed)
Moderate persistent asthma  Continue Symbicort 160/4.5 g, 2 inhalations twice a day.  To maximize pulmonary deposition, a spacer has been provided along with instructions for its proper administration with an HFA inhaler.  If adding a spacer does not decrease her albuterol requirement to fewer than 2 times per week on average, we will step up therapy.  Continue albuterol every 4-6 hours as needed.  Subjective and objective measures of pulmonary function will be followed and the treatment plan will be adjusted accordingly.  Allergic rhinitis Currently with suboptimal control.  Continue appropriate allergen avoidance measures and montelukast 10 mg daily.  A prescription has been provided for [facility], 2 actuations per nostril twice a day. Proper technique has been discussed and demonstrated.  Aeroallergen immunotherapy vials will be remixed with the inclusion of dog epithelia.  Anosmia Severe congestion secondary to allergic rhinitis versus nasal polyps.  A prescription has been provided for [facility] (as above).   Return in about 5 months (around 04/29/2017), or if symptoms worsen or fail to improve.

## 2016-11-27 NOTE — Assessment & Plan Note (Signed)
   Continue Symbicort 160/4.5 g, 2 inhalations twice a day.  To maximize pulmonary deposition, a spacer has been provided along with instructions for its proper administration with an HFA inhaler.  If adding a spacer does not decrease her albuterol requirement to fewer than 2 times per week on average, we will step up therapy.  Continue albuterol every 4-6 hours as needed.  Subjective and objective measures of pulmonary function will be followed and the treatment plan will be adjusted accordingly.

## 2016-11-28 ENCOUNTER — Ambulatory Visit: Payer: Self-pay | Admitting: Allergy

## 2016-11-28 DIAGNOSIS — H1010 Acute atopic conjunctivitis, unspecified eye: Secondary | ICD-10-CM

## 2016-11-28 DIAGNOSIS — J309 Allergic rhinitis, unspecified: Secondary | ICD-10-CM

## 2016-11-28 NOTE — Progress Notes (Signed)
Pt notes being symptomatic with dog exposure and voiced previous sensitization thru old allergist  and request this be added to her ITX.  Reordering pollen-cat vial to include dog.  Pt is aware she will have to restart this vial over from beginning due to addition of dog.    She will continue current build-up to red 0.6ml of mold/mite/cr vial.

## 2016-11-29 DIAGNOSIS — J301 Allergic rhinitis due to pollen: Secondary | ICD-10-CM | POA: Diagnosis not present

## 2016-11-30 NOTE — Addendum Note (Signed)
Addended by: Berna Bue on: 11/30/2016 11:59 AM   Modules accepted: Orders

## 2016-11-30 NOTE — Progress Notes (Signed)
VIALS EXP 12-01-17 

## 2016-11-30 NOTE — Addendum Note (Signed)
Addended by: Berna Bue on: 11/30/2016 12:16 PM   Modules accepted: Orders

## 2016-11-30 NOTE — Addendum Note (Signed)
Addended by: Berna Bue on: 11/30/2016 11:58 AM   Modules accepted: Orders

## 2016-12-07 ENCOUNTER — Ambulatory Visit (INDEPENDENT_AMBULATORY_CARE_PROVIDER_SITE_OTHER): Payer: BC Managed Care – PPO | Admitting: *Deleted

## 2016-12-07 DIAGNOSIS — J309 Allergic rhinitis, unspecified: Secondary | ICD-10-CM

## 2016-12-14 ENCOUNTER — Ambulatory Visit (INDEPENDENT_AMBULATORY_CARE_PROVIDER_SITE_OTHER): Payer: BC Managed Care – PPO

## 2016-12-14 DIAGNOSIS — J309 Allergic rhinitis, unspecified: Secondary | ICD-10-CM | POA: Diagnosis not present

## 2016-12-22 ENCOUNTER — Telehealth: Payer: Self-pay | Admitting: Allergy and Immunology

## 2016-12-22 MED ORDER — AMOXICILLIN-POT CLAVULANATE 875-125 MG PO TABS: 1.0000 | ORAL_TABLET | Freq: Two times a day (BID) | ORAL | 0 refills | Status: DC

## 2016-12-22 NOTE — Telephone Encounter (Signed)
Patient would like to speak to a nurse Patient is experiencing some wheezing, possible chest cold Not asthma related Has been "fighting" it all week. Please call

## 2016-12-22 NOTE — Telephone Encounter (Signed)
If she has been symptomatic for the past week with no improvement in symptoms she may have acute sinusitis especially with sinus pressure and cold symptoms.   So she does not have to wait until Monday for a sick visit we can prescribe Augmentin 875mg  twice a day x 10 days for treatment of sinusitis.   She should continue her routine medications and advise she do nasal saline rinses.  Continue supportive care (fluids, watch for fevers, warm salt water gargles, lozenges) to help with cold symptoms.   She can let us know how she is doing on Monday.

## 2016-12-22 NOTE — Telephone Encounter (Signed)
Pt states she has a cold. She c/o "aching" behind her eyes, a dry cough, sinus pressure, and itching in her throat. Please advise if you would like her to be seen or if we can send her anything in? She has tried OTC cough suppressant ([facility] brand) and her normal medication routine.

## 2016-12-22 NOTE — Telephone Encounter (Signed)
Pt advised. Sent over Augmentin to her pharmacy.

## 2016-12-26 ENCOUNTER — Ambulatory Visit (INDEPENDENT_AMBULATORY_CARE_PROVIDER_SITE_OTHER): Payer: BC Managed Care – PPO

## 2016-12-26 DIAGNOSIS — J309 Allergic rhinitis, unspecified: Secondary | ICD-10-CM | POA: Diagnosis not present

## 2017-01-17 ENCOUNTER — Ambulatory Visit (INDEPENDENT_AMBULATORY_CARE_PROVIDER_SITE_OTHER): Payer: BC Managed Care – PPO | Admitting: *Deleted

## 2017-01-17 DIAGNOSIS — J309 Allergic rhinitis, unspecified: Secondary | ICD-10-CM | POA: Diagnosis not present

## 2017-02-22 ENCOUNTER — Ambulatory Visit (INDEPENDENT_AMBULATORY_CARE_PROVIDER_SITE_OTHER): Payer: BC Managed Care – PPO | Admitting: *Deleted

## 2017-02-22 DIAGNOSIS — J309 Allergic rhinitis, unspecified: Secondary | ICD-10-CM | POA: Diagnosis not present

## 2017-03-07 ENCOUNTER — Ambulatory Visit (INDEPENDENT_AMBULATORY_CARE_PROVIDER_SITE_OTHER): Payer: BC Managed Care – PPO | Admitting: *Deleted

## 2017-03-07 DIAGNOSIS — J309 Allergic rhinitis, unspecified: Secondary | ICD-10-CM | POA: Diagnosis not present

## 2017-03-23 ENCOUNTER — Ambulatory Visit (INDEPENDENT_AMBULATORY_CARE_PROVIDER_SITE_OTHER): Payer: BC Managed Care – PPO

## 2017-03-23 DIAGNOSIS — J309 Allergic rhinitis, unspecified: Secondary | ICD-10-CM | POA: Diagnosis not present

## 2017-04-02 ENCOUNTER — Encounter: Payer: Self-pay | Admitting: Allergy and Immunology

## 2017-04-02 ENCOUNTER — Ambulatory Visit: Payer: BC Managed Care – PPO | Admitting: Allergy and Immunology

## 2017-04-02 VITALS — BP 116/72 | HR 76 | Ht 73.5 in | Wt 192.0 lb

## 2017-04-02 DIAGNOSIS — J4540 Moderate persistent asthma, uncomplicated: Secondary | ICD-10-CM

## 2017-04-02 DIAGNOSIS — J330 Polyp of nasal cavity: Secondary | ICD-10-CM | POA: Diagnosis not present

## 2017-04-02 DIAGNOSIS — J3089 Other allergic rhinitis: Secondary | ICD-10-CM

## 2017-04-02 MED ORDER — FLUTICASONE PROPIONATE 93 MCG/ACT NA EXHU: 2.0000 | INHALANT_SUSPENSION | Freq: Every day | NASAL | 5 refills | Status: DC

## 2017-04-02 NOTE — Assessment & Plan Note (Signed)
Currently with suboptimal control.  Continue appropriate allergen avoidance measures, aeroallergen immunotherapy injections, and montelukast 10 mg daily.  I have recommended using Xhance, 2 actuations per nostril twice a day on a scheduled rather than as needed basis.   Nasal saline spray (i.e., Simply Saline) or nasal saline lavage (i.e., NeilMed) is recommended as needed and prior to medicated nasal sprays.

## 2017-04-02 NOTE — Assessment & Plan Note (Signed)
Stable.  Continue Symbicort 160-4.5 g, 2 inhalations via spacer device twice daily, montelukast 10 mg daily bedtime, and albuterol every 6 hours if needed.  Subjective and objective measures of pulmonary function will be followed and the treatment plan will be adjusted accordingly.

## 2017-04-02 NOTE — Patient Instructions (Addendum)
Polyp of nasal cavity  Prednisone has been provided, 40 mg x3 days, 20 mg x1 day, 10 mg x1 day, then stop.  I have recommended using Xhance twice daily rather than as needed.  Nasal saline spray (i.e., Simply Saline) or nasal saline lavage (i.e., NeilMed) is recommended as needed and prior to medicated nasal sprays.  Allergic rhinitis Currently with suboptimal control.  Continue appropriate allergen avoidance measures, aeroallergen immunotherapy injections, and montelukast 10 mg daily.  I have recommended using Xhance, 2 actuations per nostril twice a day on a scheduled rather than as needed basis.   Nasal saline spray (i.e., Simply Saline) or nasal saline lavage (i.e., NeilMed) is recommended as needed and prior to medicated nasal sprays.  Moderate persistent asthma Stable.  Continue Symbicort 160-4.5 g, 2 inhalations via spacer device twice daily, montelukast 10 mg daily bedtime, and albuterol every 6 hours if needed.  Subjective and objective measures of pulmonary function will be followed and the treatment plan will be adjusted accordingly.   Return in about 4 months (around 07/31/2017), or if symptoms worsen or fail to improve.

## 2017-04-02 NOTE — Assessment & Plan Note (Signed)
   Prednisone has been provided, 40 mg x3 days, 20 mg x1 day, 10 mg x1 day, then stop.  I have recommended using Xhance twice daily rather than as needed.  Nasal saline spray (i.e., Simply Saline) or nasal saline lavage (i.e., NeilMed) is recommended as needed and prior to medicated nasal sprays.

## 2017-04-02 NOTE — Progress Notes (Signed)
Follow-up Note  RE: Donna Case MRN: [id number] DOB: 05/03/1960 Date of Office Visit: 04/02/2017  Primary care provider: Willow OraAndy, Camille L, MD Referring provider: Willow OraAndy, Camille L, MD  History of present illness: Donna Case is a 57 y.o. female with persistent asthma and allergic rhinitis on immunotherapy presenting today for follow-up.  She was last seen in this clinic in September 2018.  She reports that her asthma has been well controlled rescue, typically only if she misses a dose of Symbicort.  He is currently taking Symbicort 160-4.5 g, 1 inhalation via spacer device twice daily, and montelukast 10 mg daily at bedtime.  Daily activities are not limited by her asthma and she is not being awakened at night by lower respiratory symptoms. [name] does complain of persistent nasal congestion and episodic anosmia.  She admits that she is only using Xhance sporadically.   Assessment and plan: Polyp of nasal cavity  Prednisone has been provided, 40 mg x3 days, 20 mg x1 day, 10 mg x1 day, then stop.  I have recommended using Xhance twice daily rather than as needed.  Nasal saline spray (i.e., Simply Saline) or nasal saline lavage (i.e., NeilMed) is recommended as needed and prior to medicated nasal sprays.  Allergic rhinitis Currently with suboptimal control.  Continue appropriate allergen avoidance measures, aeroallergen immunotherapy injections, and montelukast 10 mg daily.  I have recommended using Xhance, 2 actuations per nostril twice a day on a scheduled rather than as needed basis.   Nasal saline spray (i.e., Simply Saline) or nasal saline lavage (i.e., NeilMed) is recommended as needed and prior to medicated nasal sprays.  Moderate persistent asthma Stable.  Continue Symbicort 160-4.5 g, 2 inhalations via spacer device twice daily, montelukast 10 mg daily bedtime, and albuterol every 6 hours if needed.  Subjective and objective measures of pulmonary function will be  followed and the treatment plan will be adjusted accordingly.   Meds ordered this encounter  Medications  . Fluticasone Propionate (XHANCE) 93 MCG/ACT EXHU    Sig: Place 2 sprays into both nostrils daily.    Dispense:  32 mL    Refill:  5    Phone: [phone]    Diagnostics: Spirometry:  Normal with an FEV1 of 114% predicted.  Please see scanned spirometry results for details.    Physical examination: Blood pressure 116/72, pulse 76, height 6' 1.5" (1.867 m), weight 192 lb (87.1 kg), last menstrual period 07/13/2012, SpO2 97 %.  General: Alert, interactive, in no acute distress. HEENT: TMs pearly gray, turbinates edematous without discharge, post-pharynx mildly erythematous. Neck: Supple without lymphadenopathy. Lungs: Clear to auscultation without wheezing, rhonchi or rales. CV: Normal S1, S2 without murmurs. Skin: Warm and dry, without lesions or rashes.  The following portions of the patient's history were reviewed and updated as appropriate: allergies, current medications, past family history, past medical history, past social history, past surgical history and problem list.  Allergies as of 04/02/2017      Reactions   Codeine Other (See Comments)   hallucinations   Hydrocodone Other (See Comments)   hallucintatons   Latex Itching      Medication List        Accurate as of 04/02/17 12:56 PM. Always use your most recent med list.          albuterol 108 (90 Base) MCG/ACT inhaler Commonly known as:  PROVENTIL HFA;VENTOLIN HFA Inhale 2 puffs into the lungs every 6 (six) hours as needed for wheezing.   fexofenadine 180 MG tablet  Commonly known as:  ALLEGRA take 1 tablet by mouth at bedtime for allergies   Fluticasone Propionate 93 MCG/ACT Exhu Commonly known as:  XHANCE Place 2 sprays into both nostrils 2 (two) times daily as needed.   Fluticasone Propionate 93 MCG/ACT Exhu Commonly known as:  XHANCE Place 2 sprays into both nostrils daily.   ibuprofen 800  MG tablet Commonly known as:  ADVIL,MOTRIN Take 1 tablet (800 mg total) by mouth daily as needed.   mometasone 50 MCG/ACT nasal spray Commonly known as:  NASONEX Place 2 sprays into the nose 2 (two) times daily.   montelukast 10 MG tablet Commonly known as:  SINGULAIR Take 1 tablet (10 mg total) by mouth at bedtime. Reported on 07/05/2015   omeprazole 20 MG capsule Commonly known as:  PRILOSEC Take 20 mg by mouth daily.   SYMBICORT 160-4.5 MCG/ACT inhaler Generic drug:  budesonide-formoterol Inhale 2 puffs into the lungs 2 (two) times daily.   valACYclovir 1000 MG tablet Commonly known as:  VALTREX Take 1 tablet by mouth daily as needed for mouth pain.       Allergies  Allergen Reactions  . Codeine Other (See Comments)    hallucinations  . Hydrocodone Other (See Comments)    hallucintatons  . Latex Itching    Review of systems: Review of systems negative except as noted in HPI / PMHx or noted below: Constitutional: Negative.  HENT: Negative.   Eyes: Negative.  Respiratory: Negative.   Cardiovascular: Negative.  Gastrointestinal: Negative.  Genitourinary: Negative.  Musculoskeletal: Negative.  Neurological: Negative.  Endo/Heme/Allergies: Negative.  Cutaneous: Negative.  Past Medical History:  Diagnosis Date  . Asthma   . Asthma due to environmental allergies   . GERD (gastroesophageal reflux disease)     Family History  Problem Relation Age of Onset  . Allergic rhinitis Sister     Social History   Socioeconomic History  . Marital status: Unknown    Spouse name: Not on file  . Number of children: Not on file  . Years of education: Not on file  . Highest education level: Not on file  Social Needs  . Financial resource strain: Not on file  . Food insecurity - worry: Not on file  . Food insecurity - inability: Not on file  . Transportation needs - medical: Not on file  . Transportation needs - non-medical: Not on file  Occupational History  . Not  on file  Tobacco Use  . Smoking status: Never Smoker  . Smokeless tobacco: Never Used  Substance and Sexual Activity  . Alcohol use: Yes  . Drug use: No  . Sexual activity: Not on file  Other Topics Concern  . Not on file  Social History Narrative  . Not on file    I appreciate the opportunity to take part in Michael's care. Please do not hesitate to contact me with questions.  Sincerely,   R. Jorene Guest, MD

## 2017-04-12 ENCOUNTER — Ambulatory Visit (INDEPENDENT_AMBULATORY_CARE_PROVIDER_SITE_OTHER): Payer: BC Managed Care – PPO | Admitting: *Deleted

## 2017-04-12 DIAGNOSIS — J309 Allergic rhinitis, unspecified: Secondary | ICD-10-CM | POA: Diagnosis not present

## 2017-04-19 ENCOUNTER — Other Ambulatory Visit (INDEPENDENT_AMBULATORY_CARE_PROVIDER_SITE_OTHER): Payer: Self-pay | Admitting: Orthopaedic Surgery

## 2017-04-19 MED ORDER — IBUPROFEN 800 MG PO TABS: 800.0000 mg | ORAL_TABLET | Freq: Every day | ORAL | 0 refills | Status: DC | PRN

## 2017-04-19 NOTE — Telephone Encounter (Signed)
yes

## 2017-04-19 NOTE — Telephone Encounter (Signed)
Ok to rf? 

## 2017-04-25 ENCOUNTER — Telehealth: Payer: Self-pay | Admitting: *Deleted

## 2017-04-25 NOTE — Telephone Encounter (Signed)
Called patient and informed her that [facility] to contact them to set up delivery of Xhance.  Patient stated she had received a phone call from them and had not returned their call.  Patient has been sick with the flu.  [facility]

## 2017-04-26 ENCOUNTER — Ambulatory Visit (INDEPENDENT_AMBULATORY_CARE_PROVIDER_SITE_OTHER): Payer: BC Managed Care – PPO

## 2017-04-26 DIAGNOSIS — J309 Allergic rhinitis, unspecified: Secondary | ICD-10-CM | POA: Diagnosis not present

## 2017-05-15 ENCOUNTER — Ambulatory Visit (INDEPENDENT_AMBULATORY_CARE_PROVIDER_SITE_OTHER): Payer: BC Managed Care – PPO | Admitting: *Deleted

## 2017-05-15 DIAGNOSIS — J309 Allergic rhinitis, unspecified: Secondary | ICD-10-CM

## 2017-05-24 ENCOUNTER — Other Ambulatory Visit (INDEPENDENT_AMBULATORY_CARE_PROVIDER_SITE_OTHER): Payer: Self-pay | Admitting: Orthopedic Surgery

## 2017-06-05 ENCOUNTER — Ambulatory Visit (INDEPENDENT_AMBULATORY_CARE_PROVIDER_SITE_OTHER): Payer: BC Managed Care – PPO | Admitting: *Deleted

## 2017-06-05 DIAGNOSIS — J309 Allergic rhinitis, unspecified: Secondary | ICD-10-CM

## 2017-06-21 ENCOUNTER — Ambulatory Visit (INDEPENDENT_AMBULATORY_CARE_PROVIDER_SITE_OTHER): Payer: BC Managed Care – PPO | Admitting: *Deleted

## 2017-06-21 DIAGNOSIS — J309 Allergic rhinitis, unspecified: Secondary | ICD-10-CM

## 2017-06-26 ENCOUNTER — Other Ambulatory Visit (INDEPENDENT_AMBULATORY_CARE_PROVIDER_SITE_OTHER): Payer: Self-pay | Admitting: Orthopedic Surgery

## 2017-06-26 NOTE — Telephone Encounter (Signed)
Ok to rf? 

## 2017-06-26 NOTE — Telephone Encounter (Signed)
y

## 2017-07-12 ENCOUNTER — Ambulatory Visit (INDEPENDENT_AMBULATORY_CARE_PROVIDER_SITE_OTHER): Payer: BC Managed Care – PPO | Admitting: *Deleted

## 2017-07-12 DIAGNOSIS — J309 Allergic rhinitis, unspecified: Secondary | ICD-10-CM

## 2017-08-02 ENCOUNTER — Ambulatory Visit (INDEPENDENT_AMBULATORY_CARE_PROVIDER_SITE_OTHER): Payer: BC Managed Care – PPO | Admitting: *Deleted

## 2017-08-02 DIAGNOSIS — J309 Allergic rhinitis, unspecified: Secondary | ICD-10-CM | POA: Diagnosis not present

## 2017-08-03 ENCOUNTER — Other Ambulatory Visit (INDEPENDENT_AMBULATORY_CARE_PROVIDER_SITE_OTHER): Payer: Self-pay | Admitting: Orthopedic Surgery

## 2017-08-03 NOTE — Telephone Encounter (Signed)
Ok to rf? 

## 2017-08-04 NOTE — Telephone Encounter (Signed)
y

## 2017-08-08 ENCOUNTER — Ambulatory Visit (INDEPENDENT_AMBULATORY_CARE_PROVIDER_SITE_OTHER): Payer: BC Managed Care – PPO | Admitting: *Deleted

## 2017-08-08 DIAGNOSIS — J309 Allergic rhinitis, unspecified: Secondary | ICD-10-CM

## 2017-08-16 ENCOUNTER — Ambulatory Visit (INDEPENDENT_AMBULATORY_CARE_PROVIDER_SITE_OTHER): Payer: BC Managed Care – PPO

## 2017-08-16 DIAGNOSIS — J309 Allergic rhinitis, unspecified: Secondary | ICD-10-CM | POA: Diagnosis not present

## 2017-08-20 ENCOUNTER — Ambulatory Visit (INDEPENDENT_AMBULATORY_CARE_PROVIDER_SITE_OTHER): Payer: BC Managed Care – PPO

## 2017-08-20 DIAGNOSIS — J309 Allergic rhinitis, unspecified: Secondary | ICD-10-CM | POA: Diagnosis not present

## 2017-08-28 ENCOUNTER — Ambulatory Visit (INDEPENDENT_AMBULATORY_CARE_PROVIDER_SITE_OTHER): Payer: BC Managed Care – PPO | Admitting: *Deleted

## 2017-08-28 DIAGNOSIS — J309 Allergic rhinitis, unspecified: Secondary | ICD-10-CM

## 2017-09-10 ENCOUNTER — Ambulatory Visit (INDEPENDENT_AMBULATORY_CARE_PROVIDER_SITE_OTHER): Payer: BC Managed Care – PPO | Admitting: *Deleted

## 2017-09-10 DIAGNOSIS — J309 Allergic rhinitis, unspecified: Secondary | ICD-10-CM

## 2017-09-12 ENCOUNTER — Ambulatory Visit (INDEPENDENT_AMBULATORY_CARE_PROVIDER_SITE_OTHER): Payer: BC Managed Care – PPO | Admitting: *Deleted

## 2017-09-12 DIAGNOSIS — J309 Allergic rhinitis, unspecified: Secondary | ICD-10-CM

## 2017-09-14 ENCOUNTER — Encounter: Payer: Self-pay | Admitting: *Deleted

## 2017-09-14 DIAGNOSIS — J3089 Other allergic rhinitis: Secondary | ICD-10-CM

## 2017-09-14 NOTE — Progress Notes (Signed)
Maintenance vial made. Exp: 09-15-18. hv 

## 2017-09-18 ENCOUNTER — Ambulatory Visit (INDEPENDENT_AMBULATORY_CARE_PROVIDER_SITE_OTHER): Payer: BC Managed Care – PPO | Admitting: *Deleted

## 2017-09-18 DIAGNOSIS — J309 Allergic rhinitis, unspecified: Secondary | ICD-10-CM | POA: Diagnosis not present

## 2017-09-20 ENCOUNTER — Ambulatory Visit (INDEPENDENT_AMBULATORY_CARE_PROVIDER_SITE_OTHER): Payer: BC Managed Care – PPO | Admitting: *Deleted

## 2017-09-20 ENCOUNTER — Other Ambulatory Visit (INDEPENDENT_AMBULATORY_CARE_PROVIDER_SITE_OTHER): Payer: Self-pay | Admitting: Orthopedic Surgery

## 2017-09-20 DIAGNOSIS — J309 Allergic rhinitis, unspecified: Secondary | ICD-10-CM

## 2017-09-20 NOTE — Telephone Encounter (Signed)
Ok to rf? 

## 2017-09-21 NOTE — Telephone Encounter (Signed)
y

## 2017-09-26 ENCOUNTER — Ambulatory Visit (INDEPENDENT_AMBULATORY_CARE_PROVIDER_SITE_OTHER): Payer: BC Managed Care – PPO | Admitting: *Deleted

## 2017-09-26 DIAGNOSIS — J309 Allergic rhinitis, unspecified: Secondary | ICD-10-CM | POA: Diagnosis not present

## 2017-09-27 ENCOUNTER — Other Ambulatory Visit: Payer: Self-pay | Admitting: Allergy and Immunology

## 2017-09-27 DIAGNOSIS — J4540 Moderate persistent asthma, uncomplicated: Secondary | ICD-10-CM

## 2017-09-28 ENCOUNTER — Ambulatory Visit (INDEPENDENT_AMBULATORY_CARE_PROVIDER_SITE_OTHER): Payer: BC Managed Care – PPO

## 2017-09-28 DIAGNOSIS — J309 Allergic rhinitis, unspecified: Secondary | ICD-10-CM | POA: Diagnosis not present

## 2017-10-03 ENCOUNTER — Ambulatory Visit (INDEPENDENT_AMBULATORY_CARE_PROVIDER_SITE_OTHER): Payer: BC Managed Care – PPO | Admitting: *Deleted

## 2017-10-03 DIAGNOSIS — J309 Allergic rhinitis, unspecified: Secondary | ICD-10-CM

## 2017-10-09 ENCOUNTER — Ambulatory Visit (INDEPENDENT_AMBULATORY_CARE_PROVIDER_SITE_OTHER): Payer: BC Managed Care – PPO | Admitting: *Deleted

## 2017-10-09 DIAGNOSIS — J309 Allergic rhinitis, unspecified: Secondary | ICD-10-CM

## 2017-10-18 ENCOUNTER — Ambulatory Visit (INDEPENDENT_AMBULATORY_CARE_PROVIDER_SITE_OTHER): Payer: BC Managed Care – PPO | Admitting: *Deleted

## 2017-10-18 DIAGNOSIS — J309 Allergic rhinitis, unspecified: Secondary | ICD-10-CM

## 2017-10-25 ENCOUNTER — Ambulatory Visit (INDEPENDENT_AMBULATORY_CARE_PROVIDER_SITE_OTHER): Payer: BC Managed Care – PPO | Admitting: *Deleted

## 2017-10-25 DIAGNOSIS — J309 Allergic rhinitis, unspecified: Secondary | ICD-10-CM | POA: Diagnosis not present

## 2017-10-26 ENCOUNTER — Other Ambulatory Visit (INDEPENDENT_AMBULATORY_CARE_PROVIDER_SITE_OTHER): Payer: Self-pay | Admitting: Orthopedic Surgery

## 2017-10-26 NOTE — Telephone Encounter (Signed)
Donna Case patient

## 2017-10-26 NOTE — Telephone Encounter (Signed)
Ok to refill 

## 2017-10-31 NOTE — Progress Notes (Signed)
VIAL EXP 11-01-18 

## 2017-11-01 ENCOUNTER — Ambulatory Visit (INDEPENDENT_AMBULATORY_CARE_PROVIDER_SITE_OTHER): Payer: BC Managed Care – PPO | Admitting: *Deleted

## 2017-11-01 DIAGNOSIS — J309 Allergic rhinitis, unspecified: Secondary | ICD-10-CM

## 2017-11-07 DIAGNOSIS — J301 Allergic rhinitis due to pollen: Secondary | ICD-10-CM

## 2017-11-08 ENCOUNTER — Ambulatory Visit (INDEPENDENT_AMBULATORY_CARE_PROVIDER_SITE_OTHER): Payer: BC Managed Care – PPO | Admitting: *Deleted

## 2017-11-08 DIAGNOSIS — J309 Allergic rhinitis, unspecified: Secondary | ICD-10-CM

## 2017-11-15 ENCOUNTER — Ambulatory Visit (INDEPENDENT_AMBULATORY_CARE_PROVIDER_SITE_OTHER): Payer: BC Managed Care – PPO | Admitting: *Deleted

## 2017-11-15 DIAGNOSIS — J309 Allergic rhinitis, unspecified: Secondary | ICD-10-CM

## 2017-11-27 ENCOUNTER — Ambulatory Visit (INDEPENDENT_AMBULATORY_CARE_PROVIDER_SITE_OTHER): Payer: BC Managed Care – PPO | Admitting: *Deleted

## 2017-11-27 DIAGNOSIS — J309 Allergic rhinitis, unspecified: Secondary | ICD-10-CM | POA: Diagnosis not present

## 2017-12-06 ENCOUNTER — Ambulatory Visit (INDEPENDENT_AMBULATORY_CARE_PROVIDER_SITE_OTHER): Payer: BC Managed Care – PPO | Admitting: *Deleted

## 2017-12-06 DIAGNOSIS — J309 Allergic rhinitis, unspecified: Secondary | ICD-10-CM

## 2017-12-10 ENCOUNTER — Ambulatory Visit: Payer: BC Managed Care – PPO | Admitting: Allergy and Immunology

## 2017-12-10 DIAGNOSIS — J309 Allergic rhinitis, unspecified: Secondary | ICD-10-CM

## 2017-12-13 ENCOUNTER — Ambulatory Visit (INDEPENDENT_AMBULATORY_CARE_PROVIDER_SITE_OTHER): Payer: BC Managed Care – PPO

## 2017-12-13 DIAGNOSIS — J309 Allergic rhinitis, unspecified: Secondary | ICD-10-CM

## 2017-12-20 ENCOUNTER — Ambulatory Visit (INDEPENDENT_AMBULATORY_CARE_PROVIDER_SITE_OTHER): Payer: BC Managed Care – PPO | Admitting: *Deleted

## 2017-12-20 DIAGNOSIS — J309 Allergic rhinitis, unspecified: Secondary | ICD-10-CM | POA: Diagnosis not present

## 2017-12-25 ENCOUNTER — Other Ambulatory Visit: Payer: Self-pay | Admitting: Allergy & Immunology

## 2017-12-25 DIAGNOSIS — J4540 Moderate persistent asthma, uncomplicated: Secondary | ICD-10-CM

## 2017-12-31 ENCOUNTER — Ambulatory Visit: Payer: Self-pay

## 2017-12-31 ENCOUNTER — Encounter: Payer: Self-pay | Admitting: Allergy and Immunology

## 2017-12-31 ENCOUNTER — Ambulatory Visit: Payer: BC Managed Care – PPO | Admitting: Allergy and Immunology

## 2017-12-31 VITALS — BP 130/70 | HR 77 | Temp 97.8°F | Resp 18 | Ht 72.2 in | Wt 204.4 lb

## 2017-12-31 DIAGNOSIS — J4540 Moderate persistent asthma, uncomplicated: Secondary | ICD-10-CM | POA: Diagnosis not present

## 2017-12-31 DIAGNOSIS — J3089 Other allergic rhinitis: Secondary | ICD-10-CM | POA: Diagnosis not present

## 2017-12-31 DIAGNOSIS — J309 Allergic rhinitis, unspecified: Secondary | ICD-10-CM

## 2017-12-31 DIAGNOSIS — J330 Polyp of nasal cavity: Secondary | ICD-10-CM

## 2017-12-31 DIAGNOSIS — R430 Anosmia: Secondary | ICD-10-CM

## 2017-12-31 MED ORDER — SYMBICORT 160-4.5 MCG/ACT IN AERO: INHALATION_SPRAY | RESPIRATORY_TRACT | 5 refills | Status: DC

## 2017-12-31 MED ORDER — FLUTICASONE PROPIONATE 93 MCG/ACT NA EXHU: 2.0000 | INHALANT_SUSPENSION | Freq: Two times a day (BID) | NASAL | 5 refills | Status: DC

## 2017-12-31 MED ORDER — ALBUTEROL SULFATE HFA 108 (90 BASE) MCG/ACT IN AERS: 2.0000 | INHALATION_SPRAY | Freq: Four times a day (QID) | RESPIRATORY_TRACT | 2 refills | Status: DC | PRN

## 2017-12-31 MED ORDER — FEXOFENADINE HCL 180 MG PO TABS: ORAL_TABLET | ORAL | 5 refills | Status: DC

## 2017-12-31 MED ORDER — MONTELUKAST SODIUM 10 MG PO TABS: 10.0000 mg | ORAL_TABLET | Freq: Every day | ORAL | 5 refills | Status: DC

## 2017-12-31 NOTE — Assessment & Plan Note (Signed)
   Prednisone has been provided, 40 mg x3 days, 20 mg x1 day, 10 mg x1 day, then stop.  I have recommended restarting Xhance, 2 sprays per nostril twice daily.  Nasal saline spray (i.e., Simply Saline) or nasal saline lavage (i.e., NeilMed) is recommended as needed and prior to medicated nasal sprays.

## 2017-12-31 NOTE — Patient Instructions (Addendum)
Moderate persistent asthma  Continue Symbicort 160-4.5 g, 2 inhalations twice daily, montelukast 10 mg daily bedtime, and albuterol HFA, 1 to 2 inhalations every 4-6 hours as needed..  To maximize pulmonary deposition, a spacer has been provided along with instructions for its proper administration with an HFA inhaler.  Subjective and objective measures of pulmonary function will be followed and the treatment plan will be adjusted accordingly.  Polyp of nasal cavity  Prednisone has been provided, 40 mg x3 days, 20 mg x1 day, 10 mg x1 day, then stop.  I have recommended restarting Xhance, 2 sprays per nostril twice daily.  Nasal saline spray (i.e., Simply Saline) or nasal saline lavage (i.e., NeilMed) is recommended as needed and prior to medicated nasal sprays.  Allergic rhinitis  Continue appropriate allergen avoidance measures, aeroallergen immunotherapy injections, and montelukast 10 mg daily.  I have recommended restarting Xhance and using nasal saline irrigation prior to the [place] (as above).   Return in about 5 months (around 06/01/2018), or if symptoms worsen or fail to improve.

## 2017-12-31 NOTE — Assessment & Plan Note (Signed)
   Continue appropriate allergen avoidance measures, aeroallergen immunotherapy injections, and montelukast 10 mg daily.  I have recommended restarting Xhance and using nasal saline irrigation prior to the [place] (as above).

## 2017-12-31 NOTE — Progress Notes (Signed)
Follow-up Note  RE: Donna Case MRN: [id number] DOB: 02/04/1961 Date of Office Visit: 12/31/2017  Primary care provider: Willow Ora, MD Referring provider: Willow Ora, MD  History of present illness: Donna Case is a 57 y.o. female with persistent asthma and allergic rhinitis on immunotherapy presenting today for follow-up.  She was last seen in this clinic on April 02, 2017.  She reports that over the past 2 months she has had significantly diminished sense of smell and taste.  In addition, she has been experiencing persistent nasal congestion.  She admits that she discontinued Xhance a few months ago because of an issue with refilling this medication.  She reports that her asthma has been well controlled in the interval since her previous visit wall taking Symbicort 160-4.5 g, 2 inhalations twice daily.  She admits that she has not been using a spacer device with her HFA inhalers.  Assessment and plan: Moderate persistent asthma  Continue Symbicort 160-4.5 g, 2 inhalations twice daily, montelukast 10 mg daily bedtime, and albuterol HFA, 1 to 2 inhalations every 4-6 hours as needed..  To maximize pulmonary deposition, a spacer has been provided along with instructions for its proper administration with an HFA inhaler.  Subjective and objective measures of pulmonary function will be followed and the treatment plan will be adjusted accordingly.  Polyp of nasal cavity  Prednisone has been provided, 40 mg x3 days, 20 mg x1 day, 10 mg x1 day, then stop.  I have recommended restarting Xhance, 2 sprays per nostril twice daily.  Nasal saline spray (i.e., Simply Saline) or nasal saline lavage (i.e., NeilMed) is recommended as needed and prior to medicated nasal sprays.  Allergic rhinitis  Continue appropriate allergen avoidance measures, aeroallergen immunotherapy injections, and montelukast 10 mg daily.  I have recommended restarting Xhance and using nasal saline  irrigation prior to the [place] (as above).   Meds ordered this encounter  Medications  . SYMBICORT 160-4.5 MCG/ACT inhaler    Sig: INHALE 2 PUFFS BY MOUTH 2 TIMES A DAY    Dispense:  10.2 g    Refill:  5    Last fill needs appt  . montelukast (SINGULAIR) 10 MG tablet    Sig: Take 1 tablet (10 mg total) by mouth at bedtime. Reported on 07/05/2015    Dispense:  90 tablet    Refill:  5  . fexofenadine (ALLEGRA) 180 MG tablet    Sig: take 180mg  by mouth in the morning as needed for allergies    Dispense:  30 tablet    Refill:  5  . albuterol (PROVENTIL HFA;VENTOLIN HFA) 108 (90 Base) MCG/ACT inhaler    Sig: Inhale 2 puffs into the lungs every 6 (six) hours as needed for wheezing.    Dispense:  3 Inhaler    Refill:  2  . Fluticasone Propionate (XHANCE) 93 MCG/ACT EXHU    Sig: Place 2 sprays into both nostrils 2 (two) times daily.    Dispense:  16 mL    Refill:  5    Diagnostics: Spirometry:  Normal with an FEV1 of 81% predicted with an FEV1 ratio of 89%.  Please see scanned spirometry results for details.    Physical examination: Blood pressure 130/70, pulse 77, temperature 97.8 F (36.6 C), temperature source Oral, resp. rate 18, height 6' 0.2" (1.834 m), weight 204 lb 6.4 oz (92.7 kg), last menstrual period 07/13/2012, SpO2 97 %.  General: Alert, interactive, in no acute distress. HEENT: TMs pearly gray, turbinates  moderately edematous without discharge, post-pharynx moderately erythematous. Nasal polyps visualized bilaterally. Neck: Supple without lymphadenopathy. Lungs: Clear to auscultation without wheezing, rhonchi or rales. CV: Normal S1, S2 without murmurs. Skin: Warm and dry, without lesions or rashes.  The following portions of the patient's history were reviewed and updated as appropriate: allergies, current medications, past family history, past medical history, past social history, past surgical history and problem list.  Allergies as of 12/31/2017      Reactions     Codeine Other (See Comments)   hallucinations   Hydrocodone Other (See Comments)   hallucintatons   Latex Itching      Medication List        Accurate as of 12/31/17 12:47 PM. Always use your most recent med list.          albuterol 108 (90 Base) MCG/ACT inhaler Commonly known as:  PROVENTIL HFA;VENTOLIN HFA Inhale 2 puffs into the lungs every 6 (six) hours as needed for wheezing.   fexofenadine 180 MG tablet Commonly known as:  ALLEGRA take 180mg  by mouth in the morning as needed for allergies   Fluticasone Propionate 93 MCG/ACT Exhu Place 2 sprays into both nostrils 2 (two) times daily.   ibuprofen 800 MG tablet Commonly known as:  ADVIL,MOTRIN TAKE 1 TABLET BY MOUTH DAILY AS NEEDED   mometasone 50 MCG/ACT nasal spray Commonly known as:  NASONEX Place 2 sprays into the nose 2 (two) times daily.   montelukast 10 MG tablet Commonly known as:  SINGULAIR Take 1 tablet (10 mg total) by mouth at bedtime. Reported on 07/05/2015   omeprazole 20 MG capsule Commonly known as:  PRILOSEC Take 20 mg by mouth daily.   SYMBICORT 160-4.5 MCG/ACT inhaler Generic drug:  budesonide-formoterol INHALE 2 PUFFS BY MOUTH 2 TIMES A DAY   valACYclovir 1000 MG tablet Commonly known as:  VALTREX Take 1 tablet by mouth daily as needed for mouth pain.       Allergies  Allergen Reactions  . Codeine Other (See Comments)    hallucinations  . Hydrocodone Other (See Comments)    hallucintatons  . Latex Itching   Review of systems: Review of systems negative except as noted in HPI / PMHx or noted below: Constitutional: Negative.  HENT: Negative.   Eyes: Negative.  Respiratory: Negative.   Cardiovascular: Negative.  Gastrointestinal: Negative.  Genitourinary: Negative.  Musculoskeletal: Negative.  Neurological: Negative.  Endo/Heme/Allergies: Negative.  Cutaneous: Negative.  Past Medical History:  Diagnosis Date  . Asthma   . Asthma due to environmental allergies   . GERD  (gastroesophageal reflux disease)     Family History  Problem Relation Age of Onset  . Allergic rhinitis Sister     Social History   Socioeconomic History  . Marital status: Unknown    Spouse name: Not on file  . Number of children: Not on file  . Years of education: Not on file  . Highest education level: Not on file  Occupational History  . Not on file  Social Needs  . Financial resource strain: Not on file  . Food insecurity:    Worry: Not on file    Inability: Not on file  . Transportation needs:    Medical: Not on file    Non-medical: Not on file  Tobacco Use  . Smoking status: Never Smoker  . Smokeless tobacco: Never Used  Substance and Sexual Activity  . Alcohol use: Yes  . Drug use: No  . Sexual activity: Not on file  Lifestyle  . Physical activity:    Days per week: Not on file    Minutes per session: Not on file  . Stress: Not on file  Relationships  . Social connections:    Talks on phone: Not on file    Gets together: Not on file    Attends religious service: Not on file    Active member of club or organization: Not on file    Attends meetings of clubs or organizations: Not on file    Relationship status: Not on file  . Intimate partner violence:    Fear of current or ex partner: Not on file    Emotionally abused: Not on file    Physically abused: Not on file    Forced sexual activity: Not on file  Other Topics Concern  . Not on file  Social History Narrative  . Not on file    I appreciate the opportunity to take part in Indria's care. Please do not hesitate to contact me with questions.  Sincerely,   R. Jorene Guest, MD

## 2017-12-31 NOTE — Assessment & Plan Note (Signed)
   Continue Symbicort 160-4.5 g, 2 inhalations twice daily, montelukast 10 mg daily bedtime, and albuterol HFA, 1 to 2 inhalations every 4-6 hours as needed..  To maximize pulmonary deposition, a spacer has been provided along with instructions for its proper administration with an HFA inhaler.  Subjective and objective measures of pulmonary function will be followed and the treatment plan will be adjusted accordingly.

## 2018-01-09 ENCOUNTER — Ambulatory Visit (INDEPENDENT_AMBULATORY_CARE_PROVIDER_SITE_OTHER): Payer: BC Managed Care – PPO | Admitting: *Deleted

## 2018-01-09 DIAGNOSIS — J309 Allergic rhinitis, unspecified: Secondary | ICD-10-CM | POA: Diagnosis not present

## 2018-01-17 ENCOUNTER — Ambulatory Visit (INDEPENDENT_AMBULATORY_CARE_PROVIDER_SITE_OTHER): Payer: BC Managed Care – PPO | Admitting: *Deleted

## 2018-01-17 DIAGNOSIS — J309 Allergic rhinitis, unspecified: Secondary | ICD-10-CM | POA: Diagnosis not present

## 2018-01-22 ENCOUNTER — Encounter: Payer: Self-pay | Admitting: Allergy and Immunology

## 2018-01-22 ENCOUNTER — Ambulatory Visit: Payer: BC Managed Care – PPO | Admitting: Allergy and Immunology

## 2018-01-22 ENCOUNTER — Telehealth: Payer: Worker's Compensation | Admitting: Physician Assistant

## 2018-01-22 VITALS — BP 128/76 | HR 73 | Temp 98.0°F | Resp 17

## 2018-01-22 DIAGNOSIS — J3089 Other allergic rhinitis: Secondary | ICD-10-CM | POA: Diagnosis not present

## 2018-01-22 DIAGNOSIS — J330 Polyp of nasal cavity: Secondary | ICD-10-CM

## 2018-01-22 DIAGNOSIS — J0100 Acute maxillary sinusitis, unspecified: Secondary | ICD-10-CM | POA: Diagnosis not present

## 2018-01-22 DIAGNOSIS — J4541 Moderate persistent asthma with (acute) exacerbation: Secondary | ICD-10-CM

## 2018-01-22 DIAGNOSIS — R0602 Shortness of breath: Secondary | ICD-10-CM

## 2018-01-22 MED ORDER — AMOXICILLIN-POT CLAVULANATE 875-125 MG PO TABS: 1.0000 | ORAL_TABLET | Freq: Two times a day (BID) | ORAL | 0 refills | Status: AC

## 2018-01-22 MED ORDER — PREDNISONE 1 MG PO TABS: 10.0000 mg | ORAL_TABLET | Freq: Every day | ORAL | Status: DC

## 2018-01-22 NOTE — Assessment & Plan Note (Signed)
   Prednisone has been provided, 40 mg x3 days, 20 mg x1 day, 10 mg x1 day, then stop.  Continue Symbicort 160-4.5 g, 2 elations via spacer device twice daily, montelukast 10 mg daily bedtime, and albuterol HFA, 1 to 2 inhalations every 4-6 hours as needed.  The patient has been asked to contact me if her symptoms persist or progress. Otherwise, she may return for follow up in 4 months.

## 2018-01-22 NOTE — Progress Notes (Signed)
VIALS EXP 01-24-19 

## 2018-01-22 NOTE — Assessment & Plan Note (Signed)
   Prednisone has been provided (as above).  A prescription has been provided for Augmentin 875-125 mg twice daily x10 days.  Continue Xhance as prescribed.  Nasal saline lavage (NeilMed) has been recommended as needed and prior to medicated nasal sprays along with instructions for proper administration.  For thick post nasal drainage, add guaifenesin 1200 mg (Mucinex Maximum Strength)  twice daily as needed with adequate hydration as discussed.

## 2018-01-22 NOTE — Assessment & Plan Note (Signed)
   Continue appropriate allergen avoidance measures, aeroallergen immunotherapy injections, and Xhance.

## 2018-01-22 NOTE — Patient Instructions (Addendum)
Moderate persistent asthma  Prednisone has been provided, 40 mg x3 days, 20 mg x1 day, 10 mg x1 day, then stop.  Continue Symbicort 160-4.5 g, 2 elations via spacer device twice daily, montelukast 10 mg daily bedtime, and albuterol HFA, 1 to 2 inhalations every 4-6 hours as needed.  The patient has been asked to contact me if her symptoms persist or progress. Otherwise, she may return for follow up in 4 months.  Acute sinusitis  Prednisone has been provided (as above).  A prescription has been provided for Augmentin 875-125 mg twice daily x10 days.  Continue Xhance as prescribed.  Nasal saline lavage (NeilMed) has been recommended as needed and prior to medicated nasal sprays along with instructions for proper administration.  For thick post nasal drainage, add guaifenesin 1200 mg (Mucinex Maximum Strength)  twice daily as needed with adequate hydration as discussed.  Allergic rhinitis  Continue appropriate allergen avoidance measures, aeroallergen immunotherapy injections, and Xhance.  Polyp of nasal cavity  Continue his a saline irrigation and Xhance twice daily.   Return in about 4 months (around 05/23/2018), or if symptoms worsen or fail to improve.

## 2018-01-22 NOTE — Progress Notes (Signed)
Follow-up Note  RE: Donna Case Lapp MRN: [id number] DOB: 05/26/1960 Date of Office Visit: 01/22/2018  Primary care provider: Willow OraAndy, Camille L, MD Referring provider: Willow OraAndy, Camille L, MD  History of present illness: Donna Case Ky is a 57 y.o. female with persistent asthma, nasal polyps, and allergic rhinitis and immunotherapy presenting today for sick visit.  She was previously seen in this clinic on December 31, 2017. She reports over the past week she has been experiencing sinus pressure/pain, thick post nasal drainage, discolored mucus production, and nasal congestion despite compliance with Xhance. In addition, over the same time-frame she has been experiencing more frequent asthma symptoms, requiring albuterol at least twice a day. She denies fevers or chills but has been producing green mucus. After her previous visit and starting Xhance her sense of smell has returned.  Assessment and plan: Moderate persistent asthma  Prednisone has been provided, 40 mg x3 days, 20 mg x1 day, 10 mg x1 day, then stop.  Continue Symbicort 160-4.5 g, 2 elations via spacer device twice daily, montelukast 10 mg daily bedtime, and albuterol HFA, 1 to 2 inhalations every 4-6 hours as needed.  The patient has been asked to contact me if her symptoms persist or progress. Otherwise, she may return for follow up in 4 months.  Acute sinusitis  Prednisone has been provided (as above).  A prescription has been provided for Augmentin 875-125 mg twice daily x10 days.  Continue Xhance as prescribed.  Nasal saline lavage (NeilMed) has been recommended as needed and prior to medicated nasal sprays along with instructions for proper administration.  For thick post nasal drainage, add guaifenesin 1200 mg (Mucinex Maximum Strength)  twice daily as needed with adequate hydration as discussed.  Allergic rhinitis  Continue appropriate allergen avoidance measures, aeroallergen immunotherapy injections, and  Xhance.  Polyp of nasal cavity  Continue his a saline irrigation and Xhance twice daily.   Meds ordered this encounter  Medications  . amoxicillin-clavulanate (AUGMENTIN) 875-125 MG tablet    Sig: Take 1 tablet by mouth 2 (two) times daily for 10 days.    Dispense:  20 tablet    Refill:  0  . predniSONE (DELTASONE) tablet 10 mg    Diagnostics: Spirometry reveals and FVC of 208 L (86% predicted) and an FEV1 of 2.17 L (78% predicted) with significant (470 mL, 22%) postbronchodilator improvement.  Please see scanned spirometry results for details.    Physical examination: Blood pressure 128/76, pulse 73, temperature 98 F (36.7 C), temperature source Oral, resp. rate 17, last menstrual period 07/13/2012, SpO2 98 %.  General: Alert, interactive, in no acute distress. HEENT: TMs pearly gray, turbinates edematous with thick discharge, post-pharynx erythematous. Neck: Supple without lymphadenopathy. Lungs: Mildly decreased breath sounds bilaterally without wheezing, rhonchi or rales. CV: Normal S1, S2 without murmurs. Skin: Warm and dry, without lesions or rashes.  The following portions of the patient's history were reviewed and updated as appropriate: allergies, current medications, past family history, past medical history, past social history, past surgical history and problem list.  Allergies as of 01/22/2018      Reactions   Codeine Other (See Comments)   hallucinations   Hydrocodone Other (See Comments)   hallucintatons   Latex Itching      Medication List        Accurate as of 01/22/18 12:58 PM. Always use your most recent med list.          albuterol 108 (90 Base) MCG/ACT inhaler Commonly known as:  PROVENTIL  HFA;VENTOLIN HFA Inhale 2 puffs into the lungs every 6 (six) hours as needed for wheezing.   amoxicillin-clavulanate 875-125 MG tablet Commonly known as:  AUGMENTIN Take 1 tablet by mouth 2 (two) times daily for 10 days.   fexofenadine 180 MG  tablet Commonly known as:  ALLEGRA take 180mg  by mouth in the morning as needed for allergies   Fluticasone Propionate 93 MCG/ACT Exhu Place 2 sprays into both nostrils 2 (two) times daily.   ibuprofen 800 MG tablet Commonly known as:  ADVIL,MOTRIN TAKE 1 TABLET BY MOUTH DAILY AS NEEDED   montelukast 10 MG tablet Commonly known as:  SINGULAIR Take 1 tablet (10 mg total) by mouth at bedtime. Reported on 07/05/2015   omeprazole 20 MG capsule Commonly known as:  PRILOSEC Take 20 mg by mouth daily.   SYMBICORT 160-4.5 MCG/ACT inhaler Generic drug:  budesonide-formoterol INHALE 2 PUFFS BY MOUTH 2 TIMES A DAY   valACYclovir 1000 MG tablet Commonly known as:  VALTREX Take 1 tablet by mouth daily as needed for mouth pain.       Allergies  Allergen Reactions  . Codeine Other (See Comments)    hallucinations  . Hydrocodone Other (See Comments)    hallucintatons  . Latex Itching   Review of systems: Review of systems negative except as noted in HPI / PMHx or noted below: Constitutional: Negative.  HENT: Negative.   Eyes: Negative.  Respiratory: Negative.   Cardiovascular: Negative.  Gastrointestinal: Negative.  Genitourinary: Negative.  Musculoskeletal: Negative.  Neurological: Negative.  Endo/Heme/Allergies: Negative.  Cutaneous: Negative.  Past Medical History:  Diagnosis Date  . Asthma   . Asthma due to environmental allergies   . GERD (gastroesophageal reflux disease)     Family History  Problem Relation Age of Onset  . Allergic rhinitis Sister     Social History   Socioeconomic History  . Marital status: Unknown    Spouse name: Not on file  . Number of children: Not on file  . Years of education: Not on file  . Highest education level: Not on file  Occupational History  . Not on file  Social Needs  . Financial resource strain: Not on file  . Food insecurity:    Worry: Not on file    Inability: Not on file  . Transportation needs:    Medical: Not  on file    Non-medical: Not on file  Tobacco Use  . Smoking status: Never Smoker  . Smokeless tobacco: Never Used  Substance and Sexual Activity  . Alcohol use: Yes  . Drug use: No  . Sexual activity: Not on file  Lifestyle  . Physical activity:    Days per week: Not on file    Minutes per session: Not on file  . Stress: Not on file  Relationships  . Social connections:    Talks on phone: Not on file    Gets together: Not on file    Attends religious service: Not on file    Active member of club or organization: Not on file    Attends meetings of clubs or organizations: Not on file    Relationship status: Not on file  . Intimate partner violence:    Fear of current or ex partner: Not on file    Emotionally abused: Not on file    Physically abused: Not on file    Forced sexual activity: Not on file  Other Topics Concern  . Not on file  Social History Narrative  .  Not on file    I appreciate the opportunity to take part in Anya's care. Please do not hesitate to contact me with questions.  Sincerely,   R. Jorene Guest, MD

## 2018-01-22 NOTE — Progress Notes (Signed)
Based on what you shared with me it looks like you have a serious condition that should be evaluated in a face to face office visit.  NOTE: If you entered your credit card information for this eVisit, you will not be charged. You may see a "hold" on your card for the $30 but that hold will drop off and you will not have a charge processed.  If you are having a true medical emergency please call 911.  If you need an urgent face to face visit, [facility] has four urgent care centers for your convenience.  If you need care fast and have a high deductible or no insurance consider:   [email or web address] to reserve your spot online an avoid wait times  [facility] [address], [address] [place], [place] [address] 8 am to 8 pm Monday-Friday 10 am to 4 pm Saturday-Sunday *Across the street from [employer]  [facility]  [address] [place], [address] 8 am to 5 pm Monday-Friday * In the [facility]   The following sites will take your  insurance:  . [facility]  [phone] Get Driving Directions Find a Provider at this Location  [address] [place], [place] [address] . 10 am to 8 pm Monday-Friday . 12 pm to 8 pm Saturday-Sunday   . [facility]  [phone] Get Driving Directions Find a Provider at this Location  [address], [address] [place], [place] [address] . 8 am to 8 pm Monday-Friday . 9 am to 6 pm Saturday . 11 am to 6 pm Sunday   . [facility] [place]  [phone] Get Driving Directions  [address] [place],  [address] . 8 am to 8 pm Monday-Friday . 8 am to 4 pm Saturday-Sunday   Your e-visit answers were reviewed by a board certified advanced clinical practitioner to complete your personal care plan.  Thank you for using e-Visits.  

## 2018-01-22 NOTE — Assessment & Plan Note (Signed)
   Continue his a saline irrigation and Xhance twice daily.

## 2018-01-23 DIAGNOSIS — J301 Allergic rhinitis due to pollen: Secondary | ICD-10-CM

## 2018-01-29 ENCOUNTER — Ambulatory Visit (INDEPENDENT_AMBULATORY_CARE_PROVIDER_SITE_OTHER): Payer: BC Managed Care – PPO | Admitting: *Deleted

## 2018-01-29 DIAGNOSIS — J309 Allergic rhinitis, unspecified: Secondary | ICD-10-CM

## 2018-02-05 ENCOUNTER — Other Ambulatory Visit (INDEPENDENT_AMBULATORY_CARE_PROVIDER_SITE_OTHER): Payer: Self-pay | Admitting: Orthopaedic Surgery

## 2018-02-05 NOTE — Telephone Encounter (Signed)
Ok for refill? 

## 2018-02-18 ENCOUNTER — Ambulatory Visit (INDEPENDENT_AMBULATORY_CARE_PROVIDER_SITE_OTHER): Payer: BC Managed Care – PPO | Admitting: *Deleted

## 2018-02-18 DIAGNOSIS — J309 Allergic rhinitis, unspecified: Secondary | ICD-10-CM

## 2018-03-05 ENCOUNTER — Ambulatory Visit (INDEPENDENT_AMBULATORY_CARE_PROVIDER_SITE_OTHER): Payer: BC Managed Care – PPO | Admitting: *Deleted

## 2018-03-05 DIAGNOSIS — J309 Allergic rhinitis, unspecified: Secondary | ICD-10-CM
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
## 2018-03-19 ENCOUNTER — Ambulatory Visit (INDEPENDENT_AMBULATORY_CARE_PROVIDER_SITE_OTHER): Payer: BC Managed Care – PPO

## 2018-03-19 DIAGNOSIS — J309 Allergic rhinitis, unspecified: Secondary | ICD-10-CM | POA: Diagnosis not present

## 2018-03-28 ENCOUNTER — Ambulatory Visit (INDEPENDENT_AMBULATORY_CARE_PROVIDER_SITE_OTHER): Payer: BC Managed Care – PPO

## 2018-03-28 ENCOUNTER — Other Ambulatory Visit (INDEPENDENT_AMBULATORY_CARE_PROVIDER_SITE_OTHER): Payer: Self-pay | Admitting: Orthopaedic Surgery

## 2018-03-28 DIAGNOSIS — J309 Allergic rhinitis, unspecified: Secondary | ICD-10-CM | POA: Diagnosis not present

## 2018-03-28 NOTE — Telephone Encounter (Signed)
Ok for refill? 

## 2018-04-04 ENCOUNTER — Ambulatory Visit (INDEPENDENT_AMBULATORY_CARE_PROVIDER_SITE_OTHER): Payer: BC Managed Care – PPO | Admitting: *Deleted

## 2018-04-04 DIAGNOSIS — J309 Allergic rhinitis, unspecified: Secondary | ICD-10-CM | POA: Diagnosis not present

## 2018-04-16 ENCOUNTER — Ambulatory Visit (INDEPENDENT_AMBULATORY_CARE_PROVIDER_SITE_OTHER): Payer: BC Managed Care – PPO | Admitting: *Deleted

## 2018-04-16 DIAGNOSIS — J309 Allergic rhinitis, unspecified: Secondary | ICD-10-CM | POA: Diagnosis not present

## 2018-04-30 ENCOUNTER — Ambulatory Visit (INDEPENDENT_AMBULATORY_CARE_PROVIDER_SITE_OTHER): Payer: BC Managed Care – PPO | Admitting: *Deleted

## 2018-04-30 DIAGNOSIS — J309 Allergic rhinitis, unspecified: Secondary | ICD-10-CM | POA: Diagnosis not present

## 2018-05-10 ENCOUNTER — Other Ambulatory Visit: Payer: Self-pay | Admitting: Allergy and Immunology

## 2018-05-10 MED ORDER — FLUTICASONE PROPIONATE 93 MCG/ACT NA EXHU: 2.0000 | INHALANT_SUSPENSION | Freq: Two times a day (BID) | NASAL | 11 refills | Status: DC

## 2018-05-14 ENCOUNTER — Ambulatory Visit (INDEPENDENT_AMBULATORY_CARE_PROVIDER_SITE_OTHER): Payer: BC Managed Care – PPO | Admitting: *Deleted

## 2018-05-14 DIAGNOSIS — J309 Allergic rhinitis, unspecified: Secondary | ICD-10-CM

## 2018-05-23 ENCOUNTER — Other Ambulatory Visit (INDEPENDENT_AMBULATORY_CARE_PROVIDER_SITE_OTHER): Payer: Self-pay | Admitting: Orthopaedic Surgery

## 2018-05-23 NOTE — Telephone Encounter (Signed)
Ok for refill? 

## 2018-05-27 ENCOUNTER — Ambulatory Visit: Payer: BC Managed Care – PPO | Admitting: Allergy and Immunology

## 2018-05-27 ENCOUNTER — Ambulatory Visit (INDEPENDENT_AMBULATORY_CARE_PROVIDER_SITE_OTHER): Payer: BC Managed Care – PPO | Admitting: *Deleted

## 2018-05-27 ENCOUNTER — Other Ambulatory Visit: Payer: Self-pay | Admitting: Allergy and Immunology

## 2018-05-27 DIAGNOSIS — J309 Allergic rhinitis, unspecified: Secondary | ICD-10-CM | POA: Diagnosis not present

## 2018-05-27 MED ORDER — ALBUTEROL SULFATE HFA 108 (90 BASE) MCG/ACT IN AERS: 2.0000 | INHALATION_SPRAY | Freq: Four times a day (QID) | RESPIRATORY_TRACT | 1 refills | Status: DC | PRN

## 2018-05-27 MED ORDER — FLUTICASONE PROPIONATE 93 MCG/ACT NA EXHU: 2.0000 | INHALANT_SUSPENSION | Freq: Two times a day (BID) | NASAL | 3 refills | Status: DC

## 2018-05-27 MED ORDER — EPINEPHRINE 0.3 MG/0.3ML IJ SOAJ: 0.3000 mg | Freq: Once | INTRAMUSCULAR | 2 refills | Status: AC

## 2018-05-27 MED ORDER — FEXOFENADINE HCL 180 MG PO TABS: ORAL_TABLET | ORAL | 3 refills | Status: DC

## 2018-05-27 MED ORDER — MONTELUKAST SODIUM 10 MG PO TABS: 10.0000 mg | ORAL_TABLET | Freq: Every day | ORAL | 3 refills | Status: DC

## 2018-05-27 MED ORDER — SYMBICORT 160-4.5 MCG/ACT IN AERO: INHALATION_SPRAY | RESPIRATORY_TRACT | 3 refills | Status: DC

## 2018-05-27 NOTE — Telephone Encounter (Signed)
Medications sent to pharmacy

## 2018-05-27 NOTE — Telephone Encounter (Signed)
Patient was going to be late, so I rescheduled her and she needs refills on all that Dr. Nunzio Cobbs has prescribed for her. [facility] on [address].

## 2018-06-03 DIAGNOSIS — J301 Allergic rhinitis due to pollen: Secondary | ICD-10-CM

## 2018-06-03 NOTE — Progress Notes (Signed)
EXP 06/03/19 

## 2018-06-12 ENCOUNTER — Ambulatory Visit (INDEPENDENT_AMBULATORY_CARE_PROVIDER_SITE_OTHER): Payer: BC Managed Care – PPO | Admitting: *Deleted

## 2018-06-12 DIAGNOSIS — J309 Allergic rhinitis, unspecified: Secondary | ICD-10-CM

## 2018-06-17 ENCOUNTER — Other Ambulatory Visit: Payer: Self-pay

## 2018-06-17 ENCOUNTER — Ambulatory Visit (INDEPENDENT_AMBULATORY_CARE_PROVIDER_SITE_OTHER): Payer: BC Managed Care – PPO | Admitting: Allergy and Immunology

## 2018-06-17 ENCOUNTER — Encounter: Payer: Self-pay | Admitting: Allergy and Immunology

## 2018-06-17 DIAGNOSIS — J330 Polyp of nasal cavity: Secondary | ICD-10-CM

## 2018-06-17 DIAGNOSIS — J4540 Moderate persistent asthma, uncomplicated: Secondary | ICD-10-CM | POA: Diagnosis not present

## 2018-06-17 DIAGNOSIS — J3089 Other allergic rhinitis: Secondary | ICD-10-CM | POA: Diagnosis not present

## 2018-06-17 DIAGNOSIS — K219 Gastro-esophageal reflux disease without esophagitis: Secondary | ICD-10-CM

## 2018-06-17 DIAGNOSIS — H1010 Acute atopic conjunctivitis, unspecified eye: Secondary | ICD-10-CM | POA: Insufficient documentation

## 2018-06-17 DIAGNOSIS — H1013 Acute atopic conjunctivitis, bilateral: Secondary | ICD-10-CM

## 2018-06-17 MED ORDER — TIOTROPIUM BROMIDE MONOHYDRATE 1.25 MCG/ACT IN AERS: 2.0000 | INHALATION_SPRAY | Freq: Every day | RESPIRATORY_TRACT | 5 refills | Status: DC

## 2018-06-17 MED ORDER — MONTELUKAST SODIUM 10 MG PO TABS: 10.0000 mg | ORAL_TABLET | Freq: Every day | ORAL | 3 refills | Status: DC

## 2018-06-17 NOTE — Assessment & Plan Note (Signed)
Stable.  Continue appropriate allergen avoidance measures, aeroallergen immunotherapy injections, and Xhance.  Nasal saline spray (i.e., Simply Saline) or nasal saline lavage (i.e., NeilMed) is recommended as needed and prior to medicated nasal sprays. 

## 2018-06-17 NOTE — Assessment & Plan Note (Signed)
   Continue Xhance twice daily.

## 2018-06-17 NOTE — Assessment & Plan Note (Signed)
   Continue appropriate reflux lifestyle modifications and omeprazole as prescribed.  Follow-up with gastroenterologist for EGD as recommended.

## 2018-06-17 NOTE — Progress Notes (Signed)
Follow-up Webex Note  RE: Donna SachsRachel Seelig MRN: [id number] DOB: 06/28/1960 Date of Telemedicine Visit: 06/17/2018  Primary care provider: Deveron FurlongAvery-Benjamin, Caudrean L, MD Referring provider: Willow OraAndy, Camille L, MD  Telemedicine Follow Up Visit via WebEx: I connected with Donna Sachsachel Case for a follow up on 06/17/18 via WebEx and verified that I am speaking with the correct person using two identifiers.   The limitations, risks, security and privacy concerns of performing an evaluation and management service by telemedicine, the availability of in person appointments, and that there may be a patient responsible charge related to this service were discussed. The patient expressed understanding and agreed to proceed.  Patient is at home.  Provider is at the office.  Visit start time: 9:12 am Visit end time: 9:37 am [facility] consent and medical assistant/nurse: Apolonio SchneidersAshleigh.  History of present illness: Donna SachsRachel Case is a 58 y.o. female with asthma allergic rhinitis on immunotherapy presenting today for follow-up via WebEx.  She was last seen in this clinic in October 2019.  She reports that over the past month she has required albuterol rescue 2 times per day on average despite compliance with Symbicort 160-4.5 micro grams, 2 inhalations via spacer device twice daily.  She admits that she has only been taking montelukast sporadically, rather than daily as recommended.  She feels that her nasal and ocular symptoms have improved and have been well controlled while taking the aero allergen immunotherapy injections.  She notes that she has been able to go out work in her garden in the midst of the pollen and has "not been affected at all."  She has been receiving immunotherapy injections every 2 weeks without problems or complications.  She reports that while using Xhance for nasal polyps she has regained her sense of smell.  She states that her acid reflux is well controlled while  taking omeprazole, however if she misses even 1 dose she experiences symptoms.  She notes that it has been several years since her most recent EGD.  Assessment and plan: Moderate persistent asthma Currently with suboptimal control, we will step up therapy at this time.  A prescription has been provided for Spiriva Respimat 1.25 g, 2 inhalations daily.  For now, continue Symbicort 160-4.5 g, 2 inhalations via spacer device twice daily, and albuterol HFA, 1 to 2 inhalations every 4-6 hours if needed.  I have recommended taking montelukast on a scheduled daily basis rather than as needed.  Subjective and objective measures of pulmonary function will be followed and the treatment plan will be adjusted accordingly.  Allergic rhinitis Stable.  Continue appropriate allergen avoidance measures, aeroallergen immunotherapy injections, and Xhance.  Nasal saline spray (i.e., Simply Saline) or nasal saline lavage (i.e., NeilMed) is recommended as needed and prior to medicated nasal sprays.  Polyp of nasal cavity  Continue Xhance twice daily.  GERD (gastroesophageal reflux disease)  Continue appropriate reflux lifestyle modifications and omeprazole as prescribed.  Follow-up with gastroenterologist for EGD as recommended.  Allergic conjunctivitis  Continue appropriate allergen avoidance measures.  I have also recommended eye lubricant drops (i.e., Natural Tears) as needed.  If needed, we will prescribe allergy eyedrops.   Meds ordered this encounter  Medications  . Tiotropium Bromide Monohydrate (SPIRIVA RESPIMAT) 1.25 MCG/ACT AERS    Sig: Inhale 2 puffs into the lungs daily.    Dispense:  4 g    Refill:  5  . montelukast (SINGULAIR) 10 MG tablet    Sig: Take 1 tablet (10  mg total) by mouth at bedtime. Reported on 07/05/2015    Dispense:  90 tablet    Refill:  3    Diagnostics: None.  Physical examination: Physical Exam Not obtained as encounter was done via WebEx.   Previous notes and tests were reviewed.  The following portions of the patient's history were reviewed and updated as appropriate: allergies, current medications, past family history, past medical history, past social history, past surgical history and problem list.  Allergies as of 06/17/2018      Reactions   Codeine Other (See Comments)   hallucinations   Hydrocodone Other (See Comments)   hallucintatons   Latex Itching      Medication List       Accurate as of June 17, 2018  9:57 AM. Always use your most recent med list.        albuterol 108 (90 Base) MCG/ACT inhaler Commonly known as:  PROVENTIL HFA;VENTOLIN HFA Inhale 2 puffs into the lungs every 6 (six) hours as needed for wheezing.   fexofenadine 180 MG tablet Commonly known as:  ALLEGRA take 180mg  by mouth in the morning as needed for allergies   Fluticasone Propionate 93 MCG/ACT Exhu Commonly known as:  Xhance Place 2 sprays into both nostrils 2 (two) times daily.   ibuprofen 800 MG tablet Commonly known as:  ADVIL,MOTRIN TAKE 1 TABLET BY MOUTH DAILY AS NEEDED   montelukast 10 MG tablet Commonly known as:  SINGULAIR Take 1 tablet (10 mg total) by mouth at bedtime. Reported on 07/05/2015   omeprazole 20 MG capsule Commonly known as:  PRILOSEC Take 20 mg by mouth daily.   Symbicort 160-4.5 MCG/ACT inhaler Generic drug:  budesonide-formoterol INHALE 2 PUFFS BY MOUTH 2 TIMES A DAY   Tiotropium Bromide Monohydrate 1.25 MCG/ACT Aers Commonly known as:  Spiriva Respimat Inhale 2 puffs into the lungs daily.   valACYclovir 1000 MG tablet Commonly known as:  VALTREX Take 1 tablet by mouth daily as needed for mouth pain.       Allergies  Allergen Reactions  . Codeine Other (See Comments)    hallucinations  . Hydrocodone Other (See Comments)    hallucintatons  . Latex Itching   Review of systems: Review of systems negative except as noted in HPI / PMHx or noted below: Constitutional: Negative.  HENT:  Negative.   Eyes: Negative.  Respiratory: Negative.   Cardiovascular: Negative.  Gastrointestinal: Negative.  Genitourinary: Negative.  Musculoskeletal: Negative.  Neurological: Negative.  Endo/Heme/Allergies: Negative.  Cutaneous: Negative.  Past Medical History:  Diagnosis Date  . Asthma   . Asthma due to environmental allergies   . GERD (gastroesophageal reflux disease)     Family History  Problem Relation Age of Onset  . Allergic rhinitis Sister     Social History   Socioeconomic History  . Marital status: Unknown    Spouse name: Not on file  . Number of children: Not on file  . Years of education: Not on file  . Highest education level: Not on file  Occupational History  . Not on file  Social Needs  . Financial resource strain: Not on file  . Food insecurity:    Worry: Not on file    Inability: Not on file  . Transportation needs:    Medical: Not on file    Non-medical: Not on file  Tobacco Use  . Smoking status: Never Smoker  . Smokeless tobacco: Never Used  Substance and Sexual Activity  . Alcohol use: Yes  .  Drug use: No  . Sexual activity: Not on file  Lifestyle  . Physical activity:    Days per week: Not on file    Minutes per session: Not on file  . Stress: Not on file  Relationships  . Social connections:    Talks on phone: Not on file    Gets together: Not on file    Attends religious service: Not on file    Active member of club or organization: Not on file    Attends meetings of clubs or organizations: Not on file    Relationship status: Not on file  . Intimate partner violence:    Fear of current or ex partner: Not on file    Emotionally abused: Not on file    Physically abused: Not on file    Forced sexual activity: Not on file  Other Topics Concern  . Not on file  Social History Narrative  . Not on file     I discussed the assessment and treatment plan with the patient. The patient was provided an opportunity to ask questions  and all were answered. The patient agreed with the plan and demonstrated an understanding of the instructions.   The patient was advised to call back or seek an in-person evaluation if the symptoms worsen or if the condition fails to improve as anticipated.  I provided 25 minutes of non-face-to-face time during this encounter.  I appreciate the opportunity to take part in Melodye's care. Please do not hesitate to contact me with questions.  Sincerely,   R. Jorene Guest, MD

## 2018-06-17 NOTE — Assessment & Plan Note (Signed)
   Continue appropriate allergen avoidance measures.  I have also recommended eye lubricant drops (i.e., Natural Tears) as needed.  If needed, we will prescribe allergy eyedrops.

## 2018-06-17 NOTE — Assessment & Plan Note (Signed)
Currently with suboptimal control, we will step up therapy at this time.  A prescription has been provided for Spiriva Respimat 1.25 g, 2 inhalations daily.  For now, continue Symbicort 160-4.5 g, 2 inhalations via spacer device twice daily, and albuterol HFA, 1 to 2 inhalations every 4-6 hours if needed.  I have recommended taking montelukast on a scheduled daily basis rather than as needed.  Subjective and objective measures of pulmonary function will be followed and the treatment plan will be adjusted accordingly.

## 2018-06-17 NOTE — Patient Instructions (Signed)
Moderate persistent asthma Currently with suboptimal control, we will step up therapy at this time.  A prescription has been provided for Spiriva Respimat 1.25 g, 2 inhalations daily.  For now, continue Symbicort 160-4.5 g, 2 inhalations via spacer device twice daily, and albuterol HFA, 1 to 2 inhalations every 4-6 hours if needed.  I have recommended taking montelukast on a scheduled daily basis rather than as needed.  Subjective and objective measures of pulmonary function will be followed and the treatment plan will be adjusted accordingly.  Allergic rhinitis Stable.  Continue appropriate allergen avoidance measures, aeroallergen immunotherapy injections, and Xhance.  Nasal saline spray (i.e., Simply Saline) or nasal saline lavage (i.e., NeilMed) is recommended as needed and prior to medicated nasal sprays.  Polyp of nasal cavity  Continue Xhance twice daily.  GERD (gastroesophageal reflux disease)  Continue appropriate reflux lifestyle modifications and omeprazole as prescribed.  Follow-up with gastroenterologist for EGD as recommended.  Allergic conjunctivitis  Continue appropriate allergen avoidance measures.  I have also recommended eye lubricant drops (i.e., Natural Tears) as needed.  If needed, we will prescribe allergy eyedrops.   Return in about 3 months (around 09/16/2018), or if symptoms worsen or fail to improve.

## 2018-06-27 ENCOUNTER — Ambulatory Visit (INDEPENDENT_AMBULATORY_CARE_PROVIDER_SITE_OTHER): Payer: BC Managed Care – PPO | Admitting: *Deleted

## 2018-06-27 DIAGNOSIS — J309 Allergic rhinitis, unspecified: Secondary | ICD-10-CM

## 2018-07-16 ENCOUNTER — Ambulatory Visit (INDEPENDENT_AMBULATORY_CARE_PROVIDER_SITE_OTHER): Payer: BC Managed Care – PPO

## 2018-07-16 DIAGNOSIS — J309 Allergic rhinitis, unspecified: Secondary | ICD-10-CM | POA: Diagnosis not present

## 2018-07-29 DIAGNOSIS — J4540 Moderate persistent asthma, uncomplicated: Secondary | ICD-10-CM

## 2018-07-30 ENCOUNTER — Telehealth: Payer: Self-pay

## 2018-07-30 MED ORDER — PREDNISONE 10 MG PO TABS: ORAL_TABLET | ORAL | 0 refills | Status: AC

## 2018-07-30 MED ORDER — FLUTICASONE PROPIONATE HFA 110 MCG/ACT IN AERO: 2.0000 | INHALATION_SPRAY | Freq: Two times a day (BID) | RESPIRATORY_TRACT | 5 refills | Status: DC

## 2018-07-30 NOTE — Telephone Encounter (Signed)
Prescription for Flovent and prednisone sent in as instructed. Lab draw orders placed. Patient will come by tomorrow for the labs to be drawn.

## 2018-07-31 ENCOUNTER — Ambulatory Visit (INDEPENDENT_AMBULATORY_CARE_PROVIDER_SITE_OTHER): Payer: BC Managed Care – PPO | Admitting: *Deleted

## 2018-07-31 DIAGNOSIS — J309 Allergic rhinitis, unspecified: Secondary | ICD-10-CM

## 2018-08-03 LAB — CBC WITH DIFFERENTIAL/PLATELET
Basophils Absolute: 0 10*3/uL (ref 0.0–0.2)
Basos: 1 %
EOS (ABSOLUTE): 0.7 10*3/uL — ABNORMAL HIGH (ref 0.0–0.4)
Eos: 10 %
Hematocrit: 40.7 % (ref 34.0–46.6)
Hemoglobin: 13.2 g/dL (ref 11.1–15.9)
Immature Grans (Abs): 0 10*3/uL (ref 0.0–0.1)
Immature Granulocytes: 0 %
Lymphocytes Absolute: 2.3 10*3/uL (ref 0.7–3.1)
Lymphs: 31 %
MCH: 27.4 pg (ref 26.6–33.0)
MCHC: 32.4 g/dL (ref 31.5–35.7)
MCV: 85 fL (ref 79–97)
Monocytes Absolute: 0.5 10*3/uL (ref 0.1–0.9)
Monocytes: 7 %
Neutrophils Absolute: 3.9 10*3/uL (ref 1.4–7.0)
Neutrophils: 51 %
Platelets: 345 10*3/uL (ref 150–450)
RBC: 4.81 x10E6/uL (ref 3.77–5.28)
RDW: 12.9 % (ref 11.7–15.4)
WBC: 7.5 10*3/uL (ref 3.4–10.8)

## 2018-08-03 LAB — IGE: IgE (Immunoglobulin E), Serum: 328 IU/mL (ref 6–495)

## 2018-08-07 ENCOUNTER — Ambulatory Visit (INDEPENDENT_AMBULATORY_CARE_PROVIDER_SITE_OTHER): Payer: BC Managed Care – PPO

## 2018-08-07 DIAGNOSIS — J309 Allergic rhinitis, unspecified: Secondary | ICD-10-CM

## 2018-08-12 ENCOUNTER — Other Ambulatory Visit: Payer: Self-pay | Admitting: Allergy and Immunology

## 2018-08-14 ENCOUNTER — Ambulatory Visit (INDEPENDENT_AMBULATORY_CARE_PROVIDER_SITE_OTHER): Payer: BC Managed Care – PPO | Admitting: *Deleted

## 2018-08-14 ENCOUNTER — Telehealth: Payer: Self-pay | Admitting: *Deleted

## 2018-08-14 DIAGNOSIS — J4540 Moderate persistent asthma, uncomplicated: Secondary | ICD-10-CM | POA: Diagnosis not present

## 2018-08-14 NOTE — Telephone Encounter (Signed)
Patient came in and received her first [place] injection today. She would like to know when she is back to every 2 weeks on her allergy shots can she get the [place] and allergy shot on the same day? Please advise.

## 2018-08-14 NOTE — Telephone Encounter (Signed)
Unfortunately, we cannot give those on the same day because if she has a reaction we will not know which injection was the culprit. Thanks.

## 2018-08-14 NOTE — Progress Notes (Signed)
Immunotherapy   Patient Details  Name: [name] MRN: [id number] Date of Birth: 1960-04-03  08/14/2018  Rhea Belton started injections for  Dupixent 300 mg every 2 weeks.  Epi-Pen:Epi-Pen Available  Consent signed and patient instructions given. No problems after 15 minutes in the office.    Horris Latino 08/14/2018, 9:15 AM

## 2018-08-28 ENCOUNTER — Ambulatory Visit (INDEPENDENT_AMBULATORY_CARE_PROVIDER_SITE_OTHER): Payer: BC Managed Care – PPO | Admitting: *Deleted

## 2018-08-28 ENCOUNTER — Other Ambulatory Visit: Payer: Self-pay

## 2018-08-28 DIAGNOSIS — J4540 Moderate persistent asthma, uncomplicated: Secondary | ICD-10-CM

## 2018-09-02 ENCOUNTER — Ambulatory Visit (INDEPENDENT_AMBULATORY_CARE_PROVIDER_SITE_OTHER): Payer: BC Managed Care – PPO

## 2018-09-02 DIAGNOSIS — J309 Allergic rhinitis, unspecified: Secondary | ICD-10-CM

## 2018-09-11 ENCOUNTER — Ambulatory Visit (INDEPENDENT_AMBULATORY_CARE_PROVIDER_SITE_OTHER): Payer: BC Managed Care – PPO | Admitting: *Deleted

## 2018-09-11 DIAGNOSIS — J4540 Moderate persistent asthma, uncomplicated: Secondary | ICD-10-CM | POA: Diagnosis not present

## 2018-09-11 MED ORDER — DUPILUMAB 300 MG/2ML ~~LOC~~ SOSY
300.0000 mg | PREFILLED_SYRINGE | SUBCUTANEOUS | Status: AC
  Administered 2018-09-11 – 2024-04-02 (×101): 300 mg via SUBCUTANEOUS

## 2018-09-13 ENCOUNTER — Ambulatory Visit (INDEPENDENT_AMBULATORY_CARE_PROVIDER_SITE_OTHER): Payer: BC Managed Care – PPO | Admitting: *Deleted

## 2018-09-13 DIAGNOSIS — J3089 Other allergic rhinitis: Secondary | ICD-10-CM | POA: Diagnosis not present

## 2018-09-16 ENCOUNTER — Ambulatory Visit (INDEPENDENT_AMBULATORY_CARE_PROVIDER_SITE_OTHER): Payer: BC Managed Care – PPO | Admitting: Allergy and Immunology

## 2018-09-16 ENCOUNTER — Other Ambulatory Visit: Payer: Self-pay

## 2018-09-16 ENCOUNTER — Encounter: Payer: Self-pay | Admitting: Allergy and Immunology

## 2018-09-16 VITALS — BP 142/78 | HR 72 | Temp 97.9°F | Resp 16 | Ht 72.0 in | Wt 210.0 lb

## 2018-09-16 DIAGNOSIS — J330 Polyp of nasal cavity: Secondary | ICD-10-CM

## 2018-09-16 DIAGNOSIS — J4540 Moderate persistent asthma, uncomplicated: Secondary | ICD-10-CM

## 2018-09-16 DIAGNOSIS — H1013 Acute atopic conjunctivitis, bilateral: Secondary | ICD-10-CM | POA: Diagnosis not present

## 2018-09-16 DIAGNOSIS — J3089 Other allergic rhinitis: Secondary | ICD-10-CM

## 2018-09-16 MED ORDER — FEXOFENADINE HCL 180 MG PO TABS: ORAL_TABLET | ORAL | 3 refills | Status: DC

## 2018-09-16 MED ORDER — OMEPRAZOLE 20 MG PO CPDR: 20.0000 mg | DELAYED_RELEASE_CAPSULE | Freq: Every day | ORAL | 5 refills | Status: DC

## 2018-09-16 MED ORDER — MONTELUKAST SODIUM 10 MG PO TABS: 10.0000 mg | ORAL_TABLET | Freq: Every day | ORAL | 3 refills | Status: DC

## 2018-09-16 MED ORDER — SPIRIVA RESPIMAT 1.25 MCG/ACT IN AERS: 2.0000 | INHALATION_SPRAY | Freq: Every day | RESPIRATORY_TRACT | 5 refills | Status: DC

## 2018-09-16 MED ORDER — SYMBICORT 160-4.5 MCG/ACT IN AERO: INHALATION_SPRAY | RESPIRATORY_TRACT | 3 refills | Status: DC

## 2018-09-16 NOTE — Assessment & Plan Note (Signed)
   Continue Dupixent injections as prescribed and Xhance nasal spray twice daily.

## 2018-09-16 NOTE — Assessment & Plan Note (Signed)
Stable.  Continue appropriate allergen avoidance measures, aeroallergen immunotherapy injections, and Xhance.  Nasal saline spray (i.e., Simply Saline) or nasal saline lavage (i.e., NeilMed) is recommended as needed and prior to medicated nasal sprays.

## 2018-09-16 NOTE — Patient Instructions (Addendum)
Moderate persistent asthma Currently well controlled.  For now, continue Dupixent injections as prescribed, Spiriva Respimat 1.25 g, 2 inhalations daily, Symbicort 160-4.5 g, 2 inhalations via spacer device twice daily, montelukast 10 mg daily, and albuterol HFA, 1 to 2 inhalations every 4-6 hours if needed.  I have recommended taking montelukast on a scheduled daily basis rather than as needed.  Subjective and objective measures of pulmonary function will be followed and the treatment plan will be adjusted accordingly.  Allergic rhinitis Stable.  Continue appropriate allergen avoidance measures, aeroallergen immunotherapy injections, and Xhance.  Nasal saline spray (i.e., Simply Saline) or nasal saline lavage (i.e., NeilMed) is recommended as needed and prior to medicated nasal sprays.  Polyp of nasal cavity  Continue Dupixent injections as prescribed and Xhance nasal spray twice daily.   Return in about 4 months (around 01/17/2019), or if symptoms worsen or fail to improve.

## 2018-09-16 NOTE — Progress Notes (Addendum)
Follow-up Note  RE: Donna Case MRN: [id number] DOB: 12/12/1960 Date of Office Visit: 09/16/2018  Primary care provider: Deveron FurlongAvery-Benjamin, Caudrean L, MD Referring provider: Haig ProphetAvery-Benjamin, Caudrea*  History of present illness: Donna Case is a 58 y.o. female with asthma and allergic rhinitis on immunotherapy presenting today for follow-up.  She was last evaluated in this office via virtual visit on June 17, 2018.  She was started on Dupixent injections and has been receiving these injections every 2 weeks without problems or complications.  She reports that her nasal congestion has improved significantly and she now has the ability to taste and smell.  She attributes this improvement to the GeyserXhance and Dupixent.  Her asthma has been well controlled, she typically requires albuterol rescue fewer than 1 times per week on average and does not experience limitations in normal daily activities or nocturnal awakenings due to lower respiratory symptoms.  She has been compliant with the Dupixent injections, Symbicort 160-4.5 mcg, 2 inhalations via spacer device twice daily, Spiriva 1.25 mcg, 2 inhalations twice daily, and montelukast 10 mg daily at bedtime.  Assessment and plan: Moderate persistent asthma Currently well controlled.  For now, continue Dupixent injections as prescribed, Spiriva Respimat 1.25 g, 2 inhalations daily, Symbicort 160-4.5 g, 2 inhalations via spacer device twice daily, montelukast 10 mg daily, and albuterol HFA, 1 to 2 inhalations every 4-6 hours if needed.  I have recommended taking montelukast on a scheduled daily basis rather than as needed.  Subjective and objective measures of pulmonary function will be followed and the treatment plan will be adjusted accordingly.  Allergic rhinitis Stable.  Continue appropriate allergen avoidance measures, aeroallergen immunotherapy injections, and Xhance.  Nasal saline spray (i.e., Simply Saline) or nasal saline lavage (i.e.,  NeilMed) is recommended as needed and prior to medicated nasal sprays.  Polyp of nasal cavity  Continue Dupixent injections as prescribed and Xhance nasal spray twice daily.   Meds ordered this encounter  Medications  . fexofenadine (ALLEGRA) 180 MG tablet    Sig: take 180mg  by mouth in the morning as needed for allergies    Dispense:  30 tablet    Refill:  3  . montelukast (SINGULAIR) 10 MG tablet    Sig: Take 1 tablet (10 mg total) by mouth at bedtime. Reported on 07/05/2015    Dispense:  90 tablet    Refill:  3  . omeprazole (PRILOSEC) 20 MG capsule    Sig: Take 1 capsule (20 mg total) by mouth daily.    Dispense:  30 capsule    Refill:  5  . SYMBICORT 160-4.5 MCG/ACT inhaler    Sig: INHALE 2 PUFFS BY MOUTH 2 TIMES A DAY    Dispense:  10.2 g    Refill:  3    Last fill needs appt  . Tiotropium Bromide Monohydrate (SPIRIVA RESPIMAT) 1.25 MCG/ACT AERS    Sig: Inhale 2 puffs into the lungs daily.    Dispense:  4 g    Refill:  5    Diagnostics: Spirometry:  Normal with an FEV1 of 90% predicted.  Please see scanned spirometry results for details.  Physical examination: Blood pressure (!) 142/78, pulse 72, temperature 97.9 F (36.6 C), temperature source Temporal, resp. rate 16, height 6' (1.829 m), weight 210 lb (95.3 kg), last menstrual period 07/13/2012, SpO2 96 %.  General: Alert, interactive, in no acute distress. HEENT: TMs pearly gray, turbinates moderately edematous with clear discharge, post-pharynx unremarkable. Neck: Supple without lymphadenopathy. Lungs: Clear to auscultation without  wheezing, rhonchi or rales. CV: Normal S1, S2 without murmurs. Skin: Warm and dry, without lesions or rashes.  The following portions of the patient's history were reviewed and updated as appropriate: allergies, current medications, past family history, past medical history, past social history, past surgical history and problem list.  Allergies as of 09/16/2018      Reactions    Codeine Other (See Comments)   hallucinations   Hydrocodone Other (See Comments)   hallucintatons   Latex Itching      Medication List       Accurate as of September 16, 2018 12:00 PM. If you have any questions, ask your nurse or doctor.        albuterol 108 (90 Base) MCG/ACT inhaler Commonly known as: VENTOLIN HFA Inhale 2 puffs into the lungs every 6 (six) hours as needed for wheezing.   Dupixent 300 MG/2ML prefilled syringe Generic drug: dupilumab INJECT 2 SYRINGES (600MG ) UNDER THE SKIN ON DAY 1, THEN INJECT 1 SYRINGE (300MG ) ON DAY 15 , THEN 1 SYRINGE EVERY OTHER WEEK THEREAFTER.   fexofenadine 180 MG tablet Commonly known as: ALLEGRA take 180mg  by mouth in the morning as needed for allergies   fluticasone 110 MCG/ACT inhaler Commonly known as: Flovent HFA Inhale 2 puffs into the lungs 2 (two) times daily. With spacer device.   Fluticasone Propionate 93 MCG/ACT Exhu Commonly known as: Xhance Place 2 sprays into both nostrils 2 (two) times daily.   ibuprofen 800 MG tablet Commonly known as: ADVIL TAKE 1 TABLET BY MOUTH DAILY AS NEEDED   montelukast 10 MG tablet Commonly known as: SINGULAIR Take 1 tablet (10 mg total) by mouth at bedtime. Reported on 07/05/2015   omeprazole 20 MG capsule Commonly known as: PRILOSEC Take 1 capsule (20 mg total) by mouth daily.   Spiriva Respimat 1.25 MCG/ACT Aers Generic drug: Tiotropium Bromide Monohydrate Inhale 2 puffs into the lungs daily.   Symbicort 160-4.5 MCG/ACT inhaler Generic drug: budesonide-formoterol INHALE 2 PUFFS BY MOUTH 2 TIMES A DAY   valACYclovir 1000 MG tablet Commonly known as: VALTREX Take 1 tablet by mouth daily as needed for mouth pain.       Allergies  Allergen Reactions  . Codeine Other (See Comments)    hallucinations  . Hydrocodone Other (See Comments)    hallucintatons  . Latex Itching   Review of systems: Review of systems negative except as noted in HPI / PMHx or noted below:  Constitutional: Negative.  HENT: Negative.   Eyes: Negative.  Respiratory: Negative.   Cardiovascular: Negative.  Gastrointestinal: Negative.  Genitourinary: Negative.  Musculoskeletal: Negative.  Neurological: Negative.  Endo/Heme/Allergies: Negative.  Cutaneous: Negative.  Past Medical History:  Diagnosis Date  . Asthma   . Asthma due to environmental allergies   . GERD (gastroesophageal reflux disease)     Family History  Problem Relation Age of Onset  . Allergic rhinitis Sister     Social History   Socioeconomic History  . Marital status: Unknown    Spouse name: Not on file  . Number of children: Not on file  . Years of education: Not on file  . Highest education level: Not on file  Occupational History  . Not on file  Social Needs  . Financial resource strain: Not on file  . Food insecurity    Worry: Not on file    Inability: Not on file  . Transportation needs    Medical: Not on file    Non-medical: Not on file  Tobacco Use  . Smoking status: Never Smoker  . Smokeless tobacco: Never Used  Substance and Sexual Activity  . Alcohol use: Yes  . Drug use: No  . Sexual activity: Not on file  Lifestyle  . Physical activity    Days per week: Not on file    Minutes per session: Not on file  . Stress: Not on file  Relationships  . Social Musicianconnections    Talks on phone: Not on file    Gets together: Not on file    Attends religious service: Not on file    Active member of club or organization: Not on file    Attends meetings of clubs or organizations: Not on file    Relationship status: Not on file  . Intimate partner violence    Fear of current or ex partner: Not on file    Emotionally abused: Not on file    Physically abused: Not on file    Forced sexual activity: Not on file  Other Topics Concern  . Not on file  Social History Narrative  . Not on file    I appreciate the opportunity to take part in Sola's care. Please do not hesitate to contact  me with questions.  Sincerely,   R. Jorene Guestarter Arvil Utz, MD

## 2018-09-16 NOTE — Assessment & Plan Note (Signed)
Currently well controlled.  For now, continue Dupixent injections as prescribed, Spiriva Respimat 1.25 g, 2 inhalations daily, Symbicort 160-4.5 g, 2 inhalations via spacer device twice daily, montelukast 10 mg daily, and albuterol HFA, 1 to 2 inhalations every 4-6 hours if needed.  I have recommended taking montelukast on a scheduled daily basis rather than as needed.  Subjective and objective measures of pulmonary function will be followed and the treatment plan will be adjusted accordingly.

## 2018-09-25 ENCOUNTER — Ambulatory Visit (INDEPENDENT_AMBULATORY_CARE_PROVIDER_SITE_OTHER): Payer: BC Managed Care – PPO | Admitting: *Deleted

## 2018-09-25 DIAGNOSIS — J4540 Moderate persistent asthma, uncomplicated: Secondary | ICD-10-CM | POA: Diagnosis not present

## 2018-09-27 ENCOUNTER — Ambulatory Visit (INDEPENDENT_AMBULATORY_CARE_PROVIDER_SITE_OTHER): Payer: BC Managed Care – PPO | Admitting: *Deleted

## 2018-09-27 DIAGNOSIS — J309 Allergic rhinitis, unspecified: Secondary | ICD-10-CM

## 2018-10-04 ENCOUNTER — Ambulatory Visit (INDEPENDENT_AMBULATORY_CARE_PROVIDER_SITE_OTHER): Payer: BC Managed Care – PPO

## 2018-10-04 DIAGNOSIS — J309 Allergic rhinitis, unspecified: Secondary | ICD-10-CM | POA: Diagnosis not present

## 2018-10-09 ENCOUNTER — Other Ambulatory Visit: Payer: Self-pay

## 2018-10-09 ENCOUNTER — Ambulatory Visit (INDEPENDENT_AMBULATORY_CARE_PROVIDER_SITE_OTHER): Payer: BC Managed Care – PPO | Admitting: *Deleted

## 2018-10-09 DIAGNOSIS — J4540 Moderate persistent asthma, uncomplicated: Secondary | ICD-10-CM | POA: Diagnosis not present

## 2018-10-15 ENCOUNTER — Ambulatory Visit (INDEPENDENT_AMBULATORY_CARE_PROVIDER_SITE_OTHER): Payer: BC Managed Care – PPO | Admitting: *Deleted

## 2018-10-15 DIAGNOSIS — J309 Allergic rhinitis, unspecified: Secondary | ICD-10-CM | POA: Diagnosis not present

## 2018-10-22 DIAGNOSIS — J301 Allergic rhinitis due to pollen: Secondary | ICD-10-CM

## 2018-10-23 ENCOUNTER — Ambulatory Visit (INDEPENDENT_AMBULATORY_CARE_PROVIDER_SITE_OTHER): Payer: BC Managed Care – PPO | Admitting: *Deleted

## 2018-10-23 ENCOUNTER — Other Ambulatory Visit: Payer: Self-pay

## 2018-10-23 DIAGNOSIS — J4540 Moderate persistent asthma, uncomplicated: Secondary | ICD-10-CM

## 2018-10-23 NOTE — Progress Notes (Signed)
VIALS EXP 10-23-19 

## 2018-10-31 ENCOUNTER — Ambulatory Visit (INDEPENDENT_AMBULATORY_CARE_PROVIDER_SITE_OTHER): Payer: BC Managed Care – PPO | Admitting: *Deleted

## 2018-10-31 DIAGNOSIS — J309 Allergic rhinitis, unspecified: Secondary | ICD-10-CM | POA: Diagnosis not present

## 2018-11-06 ENCOUNTER — Other Ambulatory Visit: Payer: Self-pay

## 2018-11-06 ENCOUNTER — Ambulatory Visit (INDEPENDENT_AMBULATORY_CARE_PROVIDER_SITE_OTHER): Payer: BC Managed Care – PPO | Admitting: *Deleted

## 2018-11-06 DIAGNOSIS — J4540 Moderate persistent asthma, uncomplicated: Secondary | ICD-10-CM | POA: Diagnosis not present

## 2018-11-15 ENCOUNTER — Ambulatory Visit (INDEPENDENT_AMBULATORY_CARE_PROVIDER_SITE_OTHER): Payer: BC Managed Care – PPO

## 2018-11-15 DIAGNOSIS — J309 Allergic rhinitis, unspecified: Secondary | ICD-10-CM | POA: Diagnosis not present

## 2018-11-20 ENCOUNTER — Ambulatory Visit (INDEPENDENT_AMBULATORY_CARE_PROVIDER_SITE_OTHER): Payer: BC Managed Care – PPO | Admitting: *Deleted

## 2018-11-20 ENCOUNTER — Other Ambulatory Visit: Payer: Self-pay

## 2018-11-20 DIAGNOSIS — J4540 Moderate persistent asthma, uncomplicated: Secondary | ICD-10-CM

## 2018-11-20 DIAGNOSIS — J309 Allergic rhinitis, unspecified: Secondary | ICD-10-CM

## 2018-12-04 ENCOUNTER — Other Ambulatory Visit: Payer: Self-pay

## 2018-12-04 ENCOUNTER — Ambulatory Visit (INDEPENDENT_AMBULATORY_CARE_PROVIDER_SITE_OTHER): Payer: BC Managed Care – PPO

## 2018-12-04 DIAGNOSIS — J4540 Moderate persistent asthma, uncomplicated: Secondary | ICD-10-CM | POA: Diagnosis not present

## 2018-12-10 ENCOUNTER — Ambulatory Visit (INDEPENDENT_AMBULATORY_CARE_PROVIDER_SITE_OTHER): Payer: BC Managed Care – PPO

## 2018-12-10 DIAGNOSIS — J309 Allergic rhinitis, unspecified: Secondary | ICD-10-CM | POA: Diagnosis not present

## 2018-12-18 ENCOUNTER — Ambulatory Visit: Payer: Self-pay

## 2018-12-23 ENCOUNTER — Ambulatory Visit (INDEPENDENT_AMBULATORY_CARE_PROVIDER_SITE_OTHER): Payer: BC Managed Care – PPO

## 2018-12-23 ENCOUNTER — Other Ambulatory Visit: Payer: Self-pay

## 2018-12-23 DIAGNOSIS — J4540 Moderate persistent asthma, uncomplicated: Secondary | ICD-10-CM

## 2018-12-25 ENCOUNTER — Ambulatory Visit (INDEPENDENT_AMBULATORY_CARE_PROVIDER_SITE_OTHER): Payer: BC Managed Care – PPO | Admitting: *Deleted

## 2018-12-25 DIAGNOSIS — J309 Allergic rhinitis, unspecified: Secondary | ICD-10-CM

## 2019-01-06 ENCOUNTER — Ambulatory Visit (INDEPENDENT_AMBULATORY_CARE_PROVIDER_SITE_OTHER): Payer: BC Managed Care – PPO

## 2019-01-06 ENCOUNTER — Other Ambulatory Visit: Payer: Self-pay

## 2019-01-06 DIAGNOSIS — J4540 Moderate persistent asthma, uncomplicated: Secondary | ICD-10-CM | POA: Diagnosis not present

## 2019-01-14 ENCOUNTER — Ambulatory Visit (INDEPENDENT_AMBULATORY_CARE_PROVIDER_SITE_OTHER): Payer: BC Managed Care – PPO | Admitting: *Deleted

## 2019-01-14 DIAGNOSIS — J309 Allergic rhinitis, unspecified: Secondary | ICD-10-CM | POA: Diagnosis not present

## 2019-01-20 ENCOUNTER — Ambulatory Visit: Payer: BC Managed Care – PPO | Admitting: Allergy and Immunology

## 2019-01-20 ENCOUNTER — Ambulatory Visit: Payer: Self-pay

## 2019-01-22 ENCOUNTER — Ambulatory Visit (INDEPENDENT_AMBULATORY_CARE_PROVIDER_SITE_OTHER): Payer: BC Managed Care – PPO

## 2019-01-22 DIAGNOSIS — J309 Allergic rhinitis, unspecified: Secondary | ICD-10-CM | POA: Diagnosis not present

## 2019-01-24 ENCOUNTER — Other Ambulatory Visit: Payer: Self-pay | Admitting: Allergy and Immunology

## 2019-01-24 ENCOUNTER — Other Ambulatory Visit: Payer: Self-pay

## 2019-01-24 ENCOUNTER — Ambulatory Visit (INDEPENDENT_AMBULATORY_CARE_PROVIDER_SITE_OTHER): Payer: BC Managed Care – PPO

## 2019-01-24 DIAGNOSIS — J4540 Moderate persistent asthma, uncomplicated: Secondary | ICD-10-CM | POA: Diagnosis not present

## 2019-01-27 ENCOUNTER — Ambulatory Visit (INDEPENDENT_AMBULATORY_CARE_PROVIDER_SITE_OTHER): Payer: BC Managed Care – PPO | Admitting: Allergy and Immunology

## 2019-01-27 ENCOUNTER — Other Ambulatory Visit: Payer: Self-pay

## 2019-01-27 ENCOUNTER — Encounter: Payer: Self-pay | Admitting: Allergy and Immunology

## 2019-01-27 VITALS — BP 130/62 | HR 74 | Temp 97.6°F | Resp 16 | Ht 72.0 in

## 2019-01-27 DIAGNOSIS — J309 Allergic rhinitis, unspecified: Secondary | ICD-10-CM | POA: Diagnosis not present

## 2019-01-27 DIAGNOSIS — J330 Polyp of nasal cavity: Secondary | ICD-10-CM

## 2019-01-27 DIAGNOSIS — J3089 Other allergic rhinitis: Secondary | ICD-10-CM

## 2019-01-27 DIAGNOSIS — J4540 Moderate persistent asthma, uncomplicated: Secondary | ICD-10-CM | POA: Diagnosis not present

## 2019-01-27 DIAGNOSIS — K219 Gastro-esophageal reflux disease without esophagitis: Secondary | ICD-10-CM

## 2019-01-27 MED ORDER — BREZTRI AEROSPHERE 160-9-4.8 MCG/ACT IN AERO: 2.0000 | INHALATION_SPRAY | Freq: Two times a day (BID) | RESPIRATORY_TRACT | 5 refills | Status: DC

## 2019-01-27 MED ORDER — MONTELUKAST SODIUM 10 MG PO TABS: 10.0000 mg | ORAL_TABLET | Freq: Every day | ORAL | 3 refills | Status: DC

## 2019-01-27 MED ORDER — OMEPRAZOLE 20 MG PO CPDR: 20.0000 mg | DELAYED_RELEASE_CAPSULE | Freq: Every day | ORAL | 5 refills | Status: DC

## 2019-01-27 MED ORDER — FEXOFENADINE HCL 180 MG PO TABS: ORAL_TABLET | ORAL | 3 refills | Status: DC

## 2019-01-27 NOTE — Progress Notes (Signed)
Follow-up Note  RE: Donna Case MRN: [id number] DOB: 1960/07/04 Date of Office Visit: 01/27/2019  Primary care provider: Deveron Furlong, MD Referring provider: Haig Prophet*  History of present illness: Donna Case is a 58 y.o. female with persistent asthma, nasal polyps, and allergic rhinitis presenting today for follow-up.  She was last seen in this clinic in July 2020.  She reports that since the weather has started changing she has had increased albuterol rescue requirement, 3 times per week on average.  She does not experience nocturnal awakenings due to lower respiratory symptoms.  She admits that she ran out of Symbicort and has not had this medication refilled.  She is currently taking Spiriva and montelukast.  Her nasal congestion and sense of smell have improved while on Dupixent and Xhance.  She is self administering the Dupixent injections every 2 weeks without problems or complications.  She is receiving allergy immunotherapy injections every 2 weeks without problems or complications.  Assessment and plan: Moderate persistent asthma  A prescription has been provided for BrezTri 160, 2 inhalations via spacer device twice daily.  Discontinue Symbicort and Spiriva.  Continue Dupixent injections every 2 weeks as prescribed, montelukast 10 mg daily at bedtime, and albuterol HFA, 1 to 2 inhalations every 4-6 hours if needed.  Subjective and objective measures of pulmonary function will be followed and the treatment plan will be adjusted accordingly.  Allergic rhinitis Stable.  Continue appropriate allergen avoidance measures, aeroallergen immunotherapy injections, and Xhance.  Nasal saline spray (i.e., Simply Saline) or nasal saline lavage (i.e., NeilMed) is recommended as needed and prior to medicated nasal sprays.  Polyp of nasal cavity  Continue Dupixent injections as prescribed and Xhance nasal spray twice daily.  GERD (gastroesophageal reflux  disease)  Continue appropriate reflux lifestyle modifications and omeprazole as prescribed.  Follow-up with gastroenterologist as recommended.   Meds ordered this encounter  Medications  . omeprazole (PRILOSEC) 20 MG capsule    Sig: Take 1 capsule (20 mg total) by mouth daily.    Dispense:  30 capsule    Refill:  5  . montelukast (SINGULAIR) 10 MG tablet    Sig: Take 1 tablet (10 mg total) by mouth at bedtime. Reported on 07/05/2015    Dispense:  90 tablet    Refill:  3  . fexofenadine (ALLEGRA) 180 MG tablet    Sig: take 180mg  by mouth in the morning as needed for allergies    Dispense:  30 tablet    Refill:  3  . Budeson-Glycopyrrol-Formoterol (BREZTRI AEROSPHERE) 160-9-4.8 MCG/ACT AERO    Sig: Inhale 2 puffs into the lungs 2 (two) times daily.    Dispense:  10.7 g    Refill:  5    Diagnostics: Spirometry:  Normal with an FEV1 of 89% predicted. This study was performed while the patient was asymptomatic.  Please see scanned spirometry results for details.    Physical examination: Blood pressure 130/62, pulse 74, temperature 97.6 F (36.4 C), temperature source Temporal, resp. rate 16, height 6' (1.829 m), last menstrual period 07/13/2012, SpO2 98 %.  General: Alert, interactive, in no acute distress. HEENT: TMs pearly gray, turbinates mildly edematous without discharge, post-pharynx mildly erythematous. Neck: Supple without lymphadenopathy. Lungs: Clear to auscultation without wheezing, rhonchi or rales. CV: Normal S1, S2 without murmurs. Skin: Warm and dry, without lesions or rashes.  The following portions of the patient's history were reviewed and updated as appropriate: allergies, current medications, past family history, past medical history, past social  history, past surgical history and problem list.  Current Outpatient Medications  Medication Sig Dispense Refill  . albuterol (PROVENTIL HFA;VENTOLIN HFA) 108 (90 Base) MCG/ACT inhaler Inhale 2 puffs into the lungs  every 6 (six) hours as needed for wheezing. 3 Inhaler 1  . DUPIXENT 300 MG/2ML prefilled syringe INJECT 2 SYRINGES (600MG ) UNDER THE SKIN ON DAY 1, THEN INJECT 1 SYRINGE (300MG ) ON DAY 15 , THEN 1 SYRINGE EVERY OTHER WEEK THEREAFTER. 4 mL 11  . fexofenadine (ALLEGRA) 180 MG tablet take 180mg  by mouth in the morning as needed for allergies 30 tablet 3  . fluticasone (FLOVENT HFA) 110 MCG/ACT inhaler Inhale 2 puffs into the lungs 2 (two) times daily. With spacer device. 1 Inhaler 5  . Fluticasone Propionate (XHANCE) 93 MCG/ACT EXHU Place 2 sprays into both nostrils 2 (two) times daily. 32 mL 3  . ibuprofen (ADVIL,MOTRIN) 800 MG tablet TAKE 1 TABLET BY MOUTH DAILY AS NEEDED 30 tablet 0  . montelukast (SINGULAIR) 10 MG tablet Take 1 tablet (10 mg total) by mouth at bedtime. Reported on 07/05/2015 90 tablet 3  . omeprazole (PRILOSEC) 20 MG capsule Take 1 capsule (20 mg total) by mouth daily. 30 capsule 5  . SYMBICORT 160-4.5 MCG/ACT inhaler INHALE 2 PUFFS BY MOUTH 2 TIMES A DAY 10.2 g 3  . Tiotropium Bromide Monohydrate (SPIRIVA RESPIMAT) 1.25 MCG/ACT AERS Inhale 2 puffs into the lungs daily. 4 g 5  . valACYclovir (VALTREX) 1000 MG tablet Take 1 tablet by mouth daily as needed for mouth pain.    . Budeson-Glycopyrrol-Formoterol (BREZTRI AEROSPHERE) 160-9-4.8 MCG/ACT AERO Inhale 2 puffs into the lungs 2 (two) times daily. 10.7 g 5   Current Facility-Administered Medications  Medication Dose Route Frequency Provider Last Rate Last Dose  . dupilumab (DUPIXENT) prefilled syringe 300 mg  300 mg Subcutaneous Q14 Days Dwayne Bulkley, Heywood Ilesalph Carter, MD   300 mg at 01/24/19 1049    Allergies  Allergen Reactions  . Acetaminophen   . Codeine Other (See Comments)    hallucinations  . Hydrocodone Other (See Comments)    hallucintatons  . Latex Itching   Review of systems: Review of systems negative except as noted in HPI / PMHx or noted below: Constitutional: Negative.  HENT: Negative.   Eyes: Negative.   Respiratory: Negative.   Cardiovascular: Negative.  Gastrointestinal: Negative.  Genitourinary: Negative.  Musculoskeletal: Negative.  Neurological: Negative.  Endo/Heme/Allergies: Negative.  Cutaneous: Negative.  Past Medical History:  Diagnosis Date  . Asthma   . Asthma due to environmental allergies   . GERD (gastroesophageal reflux disease)     Family History  Problem Relation Age of Onset  . Allergic rhinitis Sister     Social History   Socioeconomic History  . Marital status: Unknown    Spouse name: Not on file  . Number of children: Not on file  . Years of education: Not on file  . Highest education level: Not on file  Occupational History  . Not on file  Social Needs  . Financial resource strain: Not on file  . Food insecurity    Worry: Not on file    Inability: Not on file  . Transportation needs    Medical: Not on file    Non-medical: Not on file  Tobacco Use  . Smoking status: Never Smoker  . Smokeless tobacco: Never Used  Substance and Sexual Activity  . Alcohol use: Yes  . Drug use: No  . Sexual activity: Not on file  Lifestyle  .  Physical activity    Days per week: Not on file    Minutes per session: Not on file  . Stress: Not on file  Relationships  . Social Herbalist on phone: Not on file    Gets together: Not on file    Attends religious service: Not on file    Active member of club or organization: Not on file    Attends meetings of clubs or organizations: Not on file    Relationship status: Not on file  . Intimate partner violence    Fear of current or ex partner: Not on file    Emotionally abused: Not on file    Physically abused: Not on file    Forced sexual activity: Not on file  Other Topics Concern  . Not on file  Social History Narrative  . Not on file     I appreciate the opportunity to take part in Donna Case's care. Please do not hesitate to contact me with questions.  Sincerely,   R. Edgar Frisk, MD

## 2019-01-27 NOTE — Assessment & Plan Note (Signed)
   A prescription has been provided for BrezTri 160, 2 inhalations via spacer device twice daily.  Discontinue Symbicort and Spiriva.  Continue Dupixent injections every 2 weeks as prescribed, montelukast 10 mg daily at bedtime, and albuterol HFA, 1 to 2 inhalations every 4-6 hours if needed.  Subjective and objective measures of pulmonary function will be followed and the treatment plan will be adjusted accordingly.

## 2019-01-27 NOTE — Assessment & Plan Note (Signed)
   Continue Dupixent injections as prescribed and Xhance nasal spray twice daily.

## 2019-01-27 NOTE — Telephone Encounter (Signed)
Please advise 

## 2019-01-27 NOTE — Assessment & Plan Note (Signed)
   Continue appropriate reflux lifestyle modifications and omeprazole as prescribed.  Follow-up with gastroenterologist as recommended.

## 2019-01-27 NOTE — Patient Instructions (Addendum)
Moderate persistent asthma  A prescription has been provided for BrezTri 160, 2 inhalations via spacer device twice daily.  Discontinue Symbicort and Spiriva.  Continue Dupixent injections every 2 weeks as prescribed, montelukast 10 mg daily at bedtime, and albuterol HFA, 1 to 2 inhalations every 4-6 hours if needed.  Subjective and objective measures of pulmonary function will be followed and the treatment plan will be adjusted accordingly.  Allergic rhinitis Stable.  Continue appropriate allergen avoidance measures, aeroallergen immunotherapy injections, and Xhance.  Nasal saline spray (i.e., Simply Saline) or nasal saline lavage (i.e., NeilMed) is recommended as needed and prior to medicated nasal sprays.  Polyp of nasal cavity  Continue Dupixent injections as prescribed and Xhance nasal spray twice daily.  GERD (gastroesophageal reflux disease)  Continue appropriate reflux lifestyle modifications and omeprazole as prescribed.  Follow-up with gastroenterologist as recommended.   Return in about 5 months (around 06/27/2019), or if symptoms worsen or fail to improve.

## 2019-01-27 NOTE — Assessment & Plan Note (Signed)
Stable.  Continue appropriate allergen avoidance measures, aeroallergen immunotherapy injections, and Xhance.  Nasal saline spray (i.e., Simply Saline) or nasal saline lavage (i.e., NeilMed) is recommended as needed and prior to medicated nasal sprays.

## 2019-02-04 ENCOUNTER — Ambulatory Visit (INDEPENDENT_AMBULATORY_CARE_PROVIDER_SITE_OTHER): Payer: BC Managed Care – PPO | Admitting: *Deleted

## 2019-02-04 DIAGNOSIS — J309 Allergic rhinitis, unspecified: Secondary | ICD-10-CM | POA: Diagnosis not present

## 2019-02-10 ENCOUNTER — Ambulatory Visit (INDEPENDENT_AMBULATORY_CARE_PROVIDER_SITE_OTHER): Payer: BC Managed Care – PPO

## 2019-02-10 ENCOUNTER — Other Ambulatory Visit: Payer: Self-pay

## 2019-02-10 DIAGNOSIS — J4540 Moderate persistent asthma, uncomplicated: Secondary | ICD-10-CM | POA: Diagnosis not present

## 2019-02-14 ENCOUNTER — Ambulatory Visit (INDEPENDENT_AMBULATORY_CARE_PROVIDER_SITE_OTHER): Payer: BC Managed Care – PPO | Admitting: *Deleted

## 2019-02-14 DIAGNOSIS — J309 Allergic rhinitis, unspecified: Secondary | ICD-10-CM | POA: Diagnosis not present

## 2019-02-17 ENCOUNTER — Ambulatory Visit (INDEPENDENT_AMBULATORY_CARE_PROVIDER_SITE_OTHER): Payer: BC Managed Care – PPO

## 2019-02-17 ENCOUNTER — Other Ambulatory Visit: Payer: Self-pay

## 2019-02-17 DIAGNOSIS — Z23 Encounter for immunization: Secondary | ICD-10-CM

## 2019-02-25 ENCOUNTER — Ambulatory Visit (INDEPENDENT_AMBULATORY_CARE_PROVIDER_SITE_OTHER): Payer: BC Managed Care – PPO

## 2019-02-25 ENCOUNTER — Other Ambulatory Visit: Payer: Self-pay

## 2019-02-25 DIAGNOSIS — J4540 Moderate persistent asthma, uncomplicated: Secondary | ICD-10-CM | POA: Diagnosis not present

## 2019-02-26 ENCOUNTER — Ambulatory Visit: Payer: BC Managed Care – PPO | Attending: Internal Medicine

## 2019-02-26 DIAGNOSIS — Z20822 Contact with and (suspected) exposure to covid-19: Secondary | ICD-10-CM

## 2019-02-27 LAB — NOVEL CORONAVIRUS, NAA: SARS-CoV-2, NAA: NOT DETECTED

## 2019-03-11 ENCOUNTER — Ambulatory Visit (INDEPENDENT_AMBULATORY_CARE_PROVIDER_SITE_OTHER): Payer: BC Managed Care – PPO

## 2019-03-11 DIAGNOSIS — J309 Allergic rhinitis, unspecified: Secondary | ICD-10-CM

## 2019-03-12 ENCOUNTER — Ambulatory Visit: Payer: Self-pay

## 2019-03-13 ENCOUNTER — Other Ambulatory Visit: Payer: Self-pay

## 2019-03-13 ENCOUNTER — Ambulatory Visit (INDEPENDENT_AMBULATORY_CARE_PROVIDER_SITE_OTHER): Payer: BC Managed Care – PPO | Admitting: *Deleted

## 2019-03-13 DIAGNOSIS — J4540 Moderate persistent asthma, uncomplicated: Secondary | ICD-10-CM | POA: Diagnosis not present

## 2019-03-25 ENCOUNTER — Ambulatory Visit (INDEPENDENT_AMBULATORY_CARE_PROVIDER_SITE_OTHER): Payer: BC Managed Care – PPO

## 2019-03-25 DIAGNOSIS — J309 Allergic rhinitis, unspecified: Secondary | ICD-10-CM | POA: Diagnosis not present

## 2019-03-27 ENCOUNTER — Ambulatory Visit (INDEPENDENT_AMBULATORY_CARE_PROVIDER_SITE_OTHER): Payer: BC Managed Care – PPO

## 2019-03-27 ENCOUNTER — Other Ambulatory Visit: Payer: Self-pay

## 2019-03-27 DIAGNOSIS — J4540 Moderate persistent asthma, uncomplicated: Secondary | ICD-10-CM | POA: Diagnosis not present

## 2019-04-08 ENCOUNTER — Ambulatory Visit (INDEPENDENT_AMBULATORY_CARE_PROVIDER_SITE_OTHER): Payer: BC Managed Care – PPO

## 2019-04-08 DIAGNOSIS — J309 Allergic rhinitis, unspecified: Secondary | ICD-10-CM

## 2019-04-11 ENCOUNTER — Ambulatory Visit (INDEPENDENT_AMBULATORY_CARE_PROVIDER_SITE_OTHER): Payer: BC Managed Care – PPO | Admitting: *Deleted

## 2019-04-11 ENCOUNTER — Other Ambulatory Visit: Payer: Self-pay

## 2019-04-11 DIAGNOSIS — J4540 Moderate persistent asthma, uncomplicated: Secondary | ICD-10-CM | POA: Diagnosis not present

## 2019-04-17 ENCOUNTER — Ambulatory Visit: Payer: BC Managed Care – PPO | Attending: Internal Medicine

## 2019-04-17 DIAGNOSIS — Z20822 Contact with and (suspected) exposure to covid-19: Secondary | ICD-10-CM

## 2019-04-18 ENCOUNTER — Other Ambulatory Visit: Payer: BC Managed Care – PPO

## 2019-04-18 LAB — NOVEL CORONAVIRUS, NAA: SARS-CoV-2, NAA: NOT DETECTED

## 2019-04-23 ENCOUNTER — Ambulatory Visit (INDEPENDENT_AMBULATORY_CARE_PROVIDER_SITE_OTHER): Payer: BC Managed Care – PPO

## 2019-04-23 DIAGNOSIS — J309 Allergic rhinitis, unspecified: Secondary | ICD-10-CM

## 2019-04-25 ENCOUNTER — Other Ambulatory Visit: Payer: Self-pay

## 2019-04-25 ENCOUNTER — Ambulatory Visit (INDEPENDENT_AMBULATORY_CARE_PROVIDER_SITE_OTHER): Payer: BC Managed Care – PPO | Admitting: *Deleted

## 2019-04-25 DIAGNOSIS — J4540 Moderate persistent asthma, uncomplicated: Secondary | ICD-10-CM | POA: Diagnosis not present

## 2019-05-09 ENCOUNTER — Ambulatory Visit (INDEPENDENT_AMBULATORY_CARE_PROVIDER_SITE_OTHER): Payer: BC Managed Care – PPO | Admitting: *Deleted

## 2019-05-09 ENCOUNTER — Other Ambulatory Visit: Payer: Self-pay

## 2019-05-09 DIAGNOSIS — J4540 Moderate persistent asthma, uncomplicated: Secondary | ICD-10-CM

## 2019-05-12 ENCOUNTER — Ambulatory Visit (INDEPENDENT_AMBULATORY_CARE_PROVIDER_SITE_OTHER): Payer: BC Managed Care – PPO

## 2019-05-12 DIAGNOSIS — J309 Allergic rhinitis, unspecified: Secondary | ICD-10-CM

## 2019-05-14 NOTE — Progress Notes (Signed)
VIALS EXP 05-13-20 

## 2019-05-15 DIAGNOSIS — J301 Allergic rhinitis due to pollen: Secondary | ICD-10-CM

## 2019-05-23 ENCOUNTER — Ambulatory Visit: Payer: Self-pay

## 2019-05-27 ENCOUNTER — Ambulatory Visit (INDEPENDENT_AMBULATORY_CARE_PROVIDER_SITE_OTHER): Payer: BC Managed Care – PPO | Admitting: *Deleted

## 2019-05-27 ENCOUNTER — Other Ambulatory Visit: Payer: Self-pay

## 2019-05-27 DIAGNOSIS — J4540 Moderate persistent asthma, uncomplicated: Secondary | ICD-10-CM | POA: Diagnosis not present

## 2019-06-02 ENCOUNTER — Ambulatory Visit (INDEPENDENT_AMBULATORY_CARE_PROVIDER_SITE_OTHER): Payer: BC Managed Care – PPO

## 2019-06-02 DIAGNOSIS — J309 Allergic rhinitis, unspecified: Secondary | ICD-10-CM

## 2019-06-10 ENCOUNTER — Other Ambulatory Visit: Payer: Self-pay

## 2019-06-10 ENCOUNTER — Ambulatory Visit (INDEPENDENT_AMBULATORY_CARE_PROVIDER_SITE_OTHER): Payer: BC Managed Care – PPO

## 2019-06-10 DIAGNOSIS — J4540 Moderate persistent asthma, uncomplicated: Secondary | ICD-10-CM | POA: Diagnosis not present

## 2019-06-30 ENCOUNTER — Ambulatory Visit: Payer: BC Managed Care – PPO | Admitting: Allergy and Immunology

## 2019-06-30 ENCOUNTER — Ambulatory Visit (INDEPENDENT_AMBULATORY_CARE_PROVIDER_SITE_OTHER): Payer: BC Managed Care – PPO

## 2019-06-30 ENCOUNTER — Other Ambulatory Visit: Payer: Self-pay

## 2019-06-30 ENCOUNTER — Encounter: Payer: Self-pay | Admitting: Allergy and Immunology

## 2019-06-30 VITALS — BP 118/74 | HR 64 | Temp 98.2°F | Resp 16

## 2019-06-30 DIAGNOSIS — J3089 Other allergic rhinitis: Secondary | ICD-10-CM

## 2019-06-30 DIAGNOSIS — J330 Polyp of nasal cavity: Secondary | ICD-10-CM | POA: Diagnosis not present

## 2019-06-30 DIAGNOSIS — J4540 Moderate persistent asthma, uncomplicated: Secondary | ICD-10-CM | POA: Diagnosis not present

## 2019-06-30 MED ORDER — MONTELUKAST SODIUM 10 MG PO TABS: 10.0000 mg | ORAL_TABLET | Freq: Every day | ORAL | 3 refills | Status: DC

## 2019-06-30 MED ORDER — BREZTRI AEROSPHERE 160-9-4.8 MCG/ACT IN AERO: 2.0000 | INHALATION_SPRAY | Freq: Two times a day (BID) | RESPIRATORY_TRACT | 5 refills | Status: DC

## 2019-06-30 NOTE — Assessment & Plan Note (Signed)
   Continue appropriate allergen avoidance measures, aeroallergen immunotherapy injections, and Xhance.  Nasal saline spray (i.e., Simply Saline) or nasal saline lavage (i.e., NeilMed) is recommended as needed and prior to medicated nasal sprays. 

## 2019-06-30 NOTE — Progress Notes (Signed)
Follow-up Note  RE: Donna Case MRN: [id number] DOB: 28-Jul-1960 Date of Office Visit: 06/30/2019  Primary care provider: Colin Broach, MD Referring provider: Demetrius Charity*  History of present illness: Donna Case is a 59 y.o. female with persistent asthma, nasal polyps, and allergic rhinitis presenting today for follow-up.  She was last seen in this clinic in November 2020.  She reports that her asthma has been "much better" in the interval since her previous visit.  She has rarely required albuterol rescue and denies limitations in normal daily activities or nocturnal awakenings due to lower respiratory symptoms.  She has been taking BrezTri 160 g, 2 inhalations twice daily, montelukast 10 mg daily, and has been receiving Dupixent injections every 2 weeks.  She admits that she has been not using a spacer device with HFA inhalers. She reports that her nasal symptoms have been stable.  She is receiving immunotherapy injections without reported problems or complications and is using Xhance nasal spray sporadically as needed.    Assessment and plan: Moderate persistent asthma  Continue BrezTri 160, 2 inhalations twice daily.  I have encouraged the consistent use of a spacer device with this controller medication.  Continue Dupixent injections every 2 weeks as prescribed, montelukast 10 mg daily at bedtime, and albuterol HFA, 1 to 2 inhalations every 4-6 hours if needed.  Subjective and objective measures of pulmonary function will be followed and the treatment plan will be adjusted accordingly.  Polyp of nasal cavity  Continue Dupixent injections as prescribed.  Xhance, 1 spray per nostril daily.  Allergic rhinitis  Continue appropriate allergen avoidance measures, aeroallergen immunotherapy injections, and Xhance.  Nasal saline spray (i.e., Simply Saline) or nasal saline lavage (i.e., NeilMed) is recommended as needed and prior to medicated nasal  sprays.   Meds ordered this encounter  Medications  . Budeson-Glycopyrrol-Formoterol (BREZTRI AEROSPHERE) 160-9-4.8 MCG/ACT AERO    Sig: Inhale 2 puffs into the lungs 2 (two) times daily.    Dispense:  10.7 g    Refill:  5  . montelukast (SINGULAIR) 10 MG tablet    Sig: Take 1 tablet (10 mg total) by mouth at bedtime. Reported on 07/05/2015    Dispense:  90 tablet    Refill:  3    Diagnostics: Spirometry reveals an FVC of 3.19 L and an FEV1 of 2.45 L (73% predicted) with an FEV1 ratio of 99%.  Please see scanned spirometry results for details.    Physical examination: Blood pressure 118/74, pulse 64, temperature 98.2 F (36.8 C), temperature source Temporal, resp. rate 16, last menstrual period 07/13/2012.  General: Alert, interactive, in no acute distress. HEENT: TMs pearly gray, turbinates minimally edematous without discharge, post-pharynx mildly erythematous. Neck: Supple without lymphadenopathy. Lungs: Clear to auscultation without wheezing, rhonchi or rales. CV: Normal S1, S2 without murmurs. Skin: Warm and dry, without lesions or rashes.  The following portions of the patient's history were reviewed and updated as appropriate: allergies, current medications, past family history, past medical history, past social history, past surgical history and problem list.  Current Outpatient Medications  Medication Sig Dispense Refill  . albuterol (PROVENTIL HFA;VENTOLIN HFA) 108 (90 Base) MCG/ACT inhaler Inhale 2 puffs into the lungs every 6 (six) hours as needed for wheezing. 3 Inhaler 1  . Budeson-Glycopyrrol-Formoterol (BREZTRI AEROSPHERE) 160-9-4.8 MCG/ACT AERO Inhale 2 puffs into the lungs 2 (two) times daily. 10.7 g 5  . DUPIXENT 300 MG/2ML prefilled syringe INJECT 2 SYRINGES (600MG ) UNDER THE SKIN ON DAY 1, THEN INJECT  1 SYRINGE (300MG ) ON DAY 15 , THEN 1 SYRINGE EVERY OTHER WEEK THEREAFTER. 4 mL 11  . fexofenadine (ALLEGRA) 180 MG tablet take 180mg  by mouth in the morning as  needed for allergies 30 tablet 3  . fluticasone (FLOVENT HFA) 110 MCG/ACT inhaler Inhale 2 puffs into the lungs 2 (two) times daily. With spacer device. 1 Inhaler 5  . Fluticasone Propionate (XHANCE) 93 MCG/ACT EXHU Place 2 sprays into both nostrils 2 (two) times daily. 32 mL 3  . ibuprofen (ADVIL,MOTRIN) 800 MG tablet TAKE 1 TABLET BY MOUTH DAILY AS NEEDED 30 tablet 0  . montelukast (SINGULAIR) 10 MG tablet Take 1 tablet (10 mg total) by mouth at bedtime. Reported on 07/05/2015 90 tablet 3  . omeprazole (PRILOSEC) 20 MG capsule Take 1 capsule (20 mg total) by mouth daily. 30 capsule 5  . SYMBICORT 160-4.5 MCG/ACT inhaler INHALE 2 PUFFS BY MOUTH 2 TIMES A DAY 10.2 g 3  . Tiotropium Bromide Monohydrate (SPIRIVA RESPIMAT) 1.25 MCG/ACT AERS Inhale 2 puffs into the lungs daily. 4 g 5  . valACYclovir (VALTREX) 1000 MG tablet Take 1 tablet by mouth daily as needed for mouth pain.     Current Facility-Administered Medications  Medication Dose Route Frequency Provider Last Rate Last Admin  . dupilumab (DUPIXENT) prefilled syringe 300 mg  300 mg Subcutaneous Q14 Days Camdin Hegner, , MD   300 mg at 06/30/19 1053    Allergies  Allergen Reactions  . Acetaminophen   . Codeine Other (See Comments)    hallucinations  . Hydrocodone Other (See Comments)    hallucintatons  . Latex Itching   Review of systems: Review of systems negative except as noted in HPI / PMHx.  Past Medical History:  Diagnosis Date  . Asthma   . Asthma due to environmental allergies   . GERD (gastroesophageal reflux disease)     Family History  Problem Relation Age of Onset  . Allergic rhinitis Sister     Social History   Socioeconomic History  . Marital status: Single    Spouse name: Not on file  . Number of children: Not on file  . Years of education: Not on file  . Highest education level: Not on file  Occupational History  . Not on file  Tobacco Use  . Smoking status: Never Smoker  . Smokeless tobacco:  Never Used  Substance and Sexual Activity  . Alcohol use: Yes  . Drug use: No  . Sexual activity: Not on file  Other Topics Concern  . Not on file  Social History Narrative  . Not on file   Social Determinants of Health   Financial Resource Strain:   . Difficulty of Paying Living Expenses:   Food Insecurity:   . Worried About Heywood Iles in the Last Year:   . 07/02/19 in the Last Year:   Transportation Needs:   . Programme researcher, broadcasting/film/video (Medical):   Barista Lack of Transportation (Non-Medical):   Physical Activity:   . Days of Exercise per Week:   . Minutes of Exercise per Session:   Stress:   . Feeling of Stress :   Social Connections:   . Frequency of Communication with Friends and Family:   . Frequency of Social Gatherings with Friends and Family:   . Attends Religious Services:   . Active Member of Clubs or Organizations:   . Attends Freight forwarder Meetings:   Marland Kitchen Marital Status:   Intimate Partner Violence:   .  Fear of Current or Ex-Partner:   . Emotionally Abused:   Marland Kitchen Physically Abused:   . Sexually Abused:     I appreciate the opportunity to take part in Mirian's care. Please do not hesitate to contact me with questions.  Sincerely,   R. Jorene Guest, MD

## 2019-06-30 NOTE — Assessment & Plan Note (Signed)
   Continue BrezTri 160, 2 inhalations twice daily.  I have encouraged the consistent use of a spacer device with this controller medication.  Continue Dupixent injections every 2 weeks as prescribed, montelukast 10 mg daily at bedtime, and albuterol HFA, 1 to 2 inhalations every 4-6 hours if needed.  Subjective and objective measures of pulmonary function will be followed and the treatment plan will be adjusted accordingly.

## 2019-06-30 NOTE — Patient Instructions (Addendum)
Moderate persistent asthma  Continue BrezTri 160, 2 inhalations twice daily.  I have encouraged the consistent use of a spacer device with this controller medication.  Continue Dupixent injections every 2 weeks as prescribed, montelukast 10 mg daily at bedtime, and albuterol HFA, 1 to 2 inhalations every 4-6 hours if needed.  Subjective and objective measures of pulmonary function will be followed and the treatment plan will be adjusted accordingly.  Polyp of nasal cavity  Continue Dupixent injections as prescribed.  Xhance, 1 spray per nostril daily.  Allergic rhinitis  Continue appropriate allergen avoidance measures, aeroallergen immunotherapy injections, and Xhance.  Nasal saline spray (i.e., Simply Saline) or nasal saline lavage (i.e., NeilMed) is recommended as needed and prior to medicated nasal sprays.   Return in about 4 months (around 10/30/2019), or if symptoms worsen or fail to improve.

## 2019-06-30 NOTE — Assessment & Plan Note (Signed)
   Continue Dupixent injections as prescribed.  Xhance, 1 spray per nostril daily.

## 2019-07-03 ENCOUNTER — Ambulatory Visit (INDEPENDENT_AMBULATORY_CARE_PROVIDER_SITE_OTHER): Payer: BC Managed Care – PPO

## 2019-07-03 DIAGNOSIS — J309 Allergic rhinitis, unspecified: Secondary | ICD-10-CM | POA: Diagnosis not present

## 2019-07-14 ENCOUNTER — Other Ambulatory Visit: Payer: Self-pay

## 2019-07-14 ENCOUNTER — Ambulatory Visit (INDEPENDENT_AMBULATORY_CARE_PROVIDER_SITE_OTHER): Payer: BC Managed Care – PPO

## 2019-07-14 DIAGNOSIS — J4550 Severe persistent asthma, uncomplicated: Secondary | ICD-10-CM

## 2019-07-14 DIAGNOSIS — J309 Allergic rhinitis, unspecified: Secondary | ICD-10-CM

## 2019-07-18 ENCOUNTER — Ambulatory Visit (INDEPENDENT_AMBULATORY_CARE_PROVIDER_SITE_OTHER): Payer: BC Managed Care – PPO

## 2019-07-18 DIAGNOSIS — J309 Allergic rhinitis, unspecified: Secondary | ICD-10-CM

## 2019-07-28 ENCOUNTER — Ambulatory Visit (INDEPENDENT_AMBULATORY_CARE_PROVIDER_SITE_OTHER): Payer: BC Managed Care – PPO

## 2019-07-28 ENCOUNTER — Other Ambulatory Visit: Payer: Self-pay

## 2019-07-28 DIAGNOSIS — J4550 Severe persistent asthma, uncomplicated: Secondary | ICD-10-CM | POA: Diagnosis not present

## 2019-08-13 ENCOUNTER — Ambulatory Visit (INDEPENDENT_AMBULATORY_CARE_PROVIDER_SITE_OTHER): Payer: BC Managed Care – PPO

## 2019-08-13 ENCOUNTER — Other Ambulatory Visit: Payer: Self-pay

## 2019-08-13 DIAGNOSIS — J4550 Severe persistent asthma, uncomplicated: Secondary | ICD-10-CM | POA: Diagnosis not present

## 2019-08-19 ENCOUNTER — Ambulatory Visit (INDEPENDENT_AMBULATORY_CARE_PROVIDER_SITE_OTHER): Payer: BC Managed Care – PPO

## 2019-08-19 DIAGNOSIS — J309 Allergic rhinitis, unspecified: Secondary | ICD-10-CM

## 2019-08-25 ENCOUNTER — Ambulatory Visit: Payer: BC Managed Care – PPO | Attending: Internal Medicine

## 2019-08-25 DIAGNOSIS — Z20822 Contact with and (suspected) exposure to covid-19: Secondary | ICD-10-CM

## 2019-08-26 LAB — SARS-COV-2, NAA 2 DAY TAT

## 2019-08-26 LAB — NOVEL CORONAVIRUS, NAA: SARS-CoV-2, NAA: NOT DETECTED

## 2019-08-28 ENCOUNTER — Other Ambulatory Visit: Payer: Self-pay

## 2019-08-28 ENCOUNTER — Ambulatory Visit (INDEPENDENT_AMBULATORY_CARE_PROVIDER_SITE_OTHER): Payer: BC Managed Care – PPO

## 2019-08-28 DIAGNOSIS — J4550 Severe persistent asthma, uncomplicated: Secondary | ICD-10-CM | POA: Diagnosis not present

## 2019-09-10 ENCOUNTER — Ambulatory Visit (INDEPENDENT_AMBULATORY_CARE_PROVIDER_SITE_OTHER): Payer: BC Managed Care – PPO

## 2019-09-10 DIAGNOSIS — J309 Allergic rhinitis, unspecified: Secondary | ICD-10-CM | POA: Diagnosis not present

## 2019-09-15 ENCOUNTER — Ambulatory Visit (INDEPENDENT_AMBULATORY_CARE_PROVIDER_SITE_OTHER): Payer: BC Managed Care – PPO

## 2019-09-15 ENCOUNTER — Other Ambulatory Visit: Payer: Self-pay

## 2019-09-15 DIAGNOSIS — J4550 Severe persistent asthma, uncomplicated: Secondary | ICD-10-CM

## 2019-09-18 ENCOUNTER — Ambulatory Visit (INDEPENDENT_AMBULATORY_CARE_PROVIDER_SITE_OTHER): Payer: BC Managed Care – PPO

## 2019-09-18 DIAGNOSIS — J309 Allergic rhinitis, unspecified: Secondary | ICD-10-CM

## 2019-09-26 ENCOUNTER — Ambulatory Visit (INDEPENDENT_AMBULATORY_CARE_PROVIDER_SITE_OTHER): Payer: BC Managed Care – PPO

## 2019-09-26 DIAGNOSIS — J309 Allergic rhinitis, unspecified: Secondary | ICD-10-CM

## 2019-09-30 ENCOUNTER — Ambulatory Visit (INDEPENDENT_AMBULATORY_CARE_PROVIDER_SITE_OTHER): Payer: BC Managed Care – PPO

## 2019-09-30 ENCOUNTER — Other Ambulatory Visit: Payer: Self-pay

## 2019-09-30 DIAGNOSIS — J4540 Moderate persistent asthma, uncomplicated: Secondary | ICD-10-CM

## 2019-09-30 DIAGNOSIS — J309 Allergic rhinitis, unspecified: Secondary | ICD-10-CM

## 2019-10-07 ENCOUNTER — Ambulatory Visit (INDEPENDENT_AMBULATORY_CARE_PROVIDER_SITE_OTHER): Payer: BC Managed Care – PPO

## 2019-10-07 DIAGNOSIS — J309 Allergic rhinitis, unspecified: Secondary | ICD-10-CM | POA: Diagnosis not present

## 2019-10-14 ENCOUNTER — Other Ambulatory Visit: Payer: Self-pay

## 2019-10-14 ENCOUNTER — Ambulatory Visit (INDEPENDENT_AMBULATORY_CARE_PROVIDER_SITE_OTHER): Payer: BC Managed Care – PPO

## 2019-10-14 DIAGNOSIS — J4550 Severe persistent asthma, uncomplicated: Secondary | ICD-10-CM

## 2019-10-14 DIAGNOSIS — J309 Allergic rhinitis, unspecified: Secondary | ICD-10-CM

## 2019-10-20 ENCOUNTER — Ambulatory Visit (INDEPENDENT_AMBULATORY_CARE_PROVIDER_SITE_OTHER): Payer: BC Managed Care – PPO

## 2019-10-20 DIAGNOSIS — J309 Allergic rhinitis, unspecified: Secondary | ICD-10-CM | POA: Diagnosis not present

## 2019-10-28 ENCOUNTER — Ambulatory Visit: Payer: Self-pay

## 2019-10-28 ENCOUNTER — Ambulatory Visit (INDEPENDENT_AMBULATORY_CARE_PROVIDER_SITE_OTHER): Payer: BC Managed Care – PPO

## 2019-10-28 ENCOUNTER — Other Ambulatory Visit: Payer: Self-pay

## 2019-10-28 DIAGNOSIS — J4550 Severe persistent asthma, uncomplicated: Secondary | ICD-10-CM | POA: Diagnosis not present

## 2019-11-03 ENCOUNTER — Ambulatory Visit: Payer: BC Managed Care – PPO | Admitting: Allergy and Immunology

## 2019-11-03 ENCOUNTER — Encounter: Payer: Self-pay | Admitting: Allergy and Immunology

## 2019-11-03 ENCOUNTER — Other Ambulatory Visit: Payer: Self-pay

## 2019-11-03 VITALS — BP 112/78 | HR 62 | Temp 98.1°F | Resp 18

## 2019-11-03 DIAGNOSIS — H1013 Acute atopic conjunctivitis, bilateral: Secondary | ICD-10-CM | POA: Diagnosis not present

## 2019-11-03 DIAGNOSIS — J3089 Other allergic rhinitis: Secondary | ICD-10-CM | POA: Diagnosis not present

## 2019-11-03 DIAGNOSIS — J330 Polyp of nasal cavity: Secondary | ICD-10-CM | POA: Diagnosis not present

## 2019-11-03 DIAGNOSIS — J4540 Moderate persistent asthma, uncomplicated: Secondary | ICD-10-CM

## 2019-11-03 MED ORDER — MONTELUKAST SODIUM 10 MG PO TABS
10.0000 mg | ORAL_TABLET | Freq: Every day | ORAL | 3 refills | Status: DC
Start: 2019-11-03 — End: 2020-03-04

## 2019-11-03 MED ORDER — ALBUTEROL SULFATE HFA 108 (90 BASE) MCG/ACT IN AERS: 2.0000 | INHALATION_SPRAY | Freq: Four times a day (QID) | RESPIRATORY_TRACT | 5 refills | Status: DC | PRN

## 2019-11-03 MED ORDER — BREZTRI AEROSPHERE 160-9-4.8 MCG/ACT IN AERO: 2.0000 | INHALATION_SPRAY | Freq: Two times a day (BID) | RESPIRATORY_TRACT | 5 refills | Status: DC

## 2019-11-03 NOTE — Progress Notes (Signed)
Follow-up Note  RE: Donna Case MRN: [id number] DOB: 1960/11/11 Date of Office Visit: 11/03/2019  Primary care provider: Deveron Furlong, MD Referring provider: Haig Prophet*  History of present illness: Donna Case is a 59 y.o. female with persistent asthma, nasal polyps, allergic rhinitis presenting today for follow-up.  She was last seen in this clinic on June 30, 2019.  She reports that in the interval since her previous visit her asthma has been well controlled.  She has not been experience limitations in normal daily activities or nocturnal awakenings due to lower respiratory symptoms.  She is receiving Dupixent injections twice weekly without problems or complications.  She is taking BrezTri 160 g, 2 inhalations via spacer device twice daily, and montelukast 10 mg daily at bedtime.  She has been receiving immunotherapy injections without problems or complications.  She reports that her nasal/sinus symptoms have improved significantly and that the immunotherapy has made "a huge difference."  She reports no new problems or complications today.  Assessment and plan: Moderate persistent asthma Stable.  Continue BrezTri 160, 2 inhalations twice daily.  I have encouraged the consistent use of a spacer device with this controller medication.  Continue Dupixent injections every 2 weeks as prescribed, montelukast 10 mg daily at bedtime, and albuterol HFA, 1 to 2 inhalations every 4-6 hours if needed.  If subjective and objective measures of pulmonary function remain stable, we will consider stepping down therapy on the next visit.  Allergic rhinitis  Continue appropriate allergen avoidance measures, aeroallergen immunotherapy injections, and Xhance.  Nasal saline spray (i.e., Simply Saline) or nasal saline lavage (i.e., NeilMed) is recommended as needed and prior to medicated nasal sprays.  Polyp of nasal cavity  Continue Dupixent injections as  prescribed.  Xhance, 1-2 spray per nostril daily.  Nasal saline spray (i.e. Simply Saline) is recommended prior to medicated nasal sprays and as needed.   Meds ordered this encounter  Medications  . albuterol (VENTOLIN HFA) 108 (90 Base) MCG/ACT inhaler    Sig: Inhale 2 puffs into the lungs every 6 (six) hours as needed for wheezing.    Dispense:  18 g    Refill:  5  . Budeson-Glycopyrrol-Formoterol (BREZTRI AEROSPHERE) 160-9-4.8 MCG/ACT AERO    Sig: Inhale 2 puffs into the lungs 2 (two) times daily.    Dispense:  10.7 g    Refill:  5  . montelukast (SINGULAIR) 10 MG tablet    Sig: Take 1 tablet (10 mg total) by mouth at bedtime. Reported on 07/05/2015    Dispense:  90 tablet    Refill:  3    Diagnostics: Spirometry:  Normal with an FEV1 of 92% predicted. This study was performed while the patient was asymptomatic.  Please see scanned spirometry results for details.    Physical examination: Blood pressure 112/78, pulse 62, temperature 98.1 F (36.7 C), temperature source Oral, resp. rate 18, last menstrual period 07/13/2012, SpO2 95 %.  General: Alert, interactive, in no acute distress. HEENT: TMs pearly gray, turbinates moderately edematous with clear discharge, post-pharynx unremarkable. Neck: Supple without lymphadenopathy. Lungs: Clear to auscultation without wheezing, rhonchi or rales. CV: Normal S1, S2 without murmurs. Skin: Warm and dry, without lesions or rashes.  The following portions of the patient's history were reviewed and updated as appropriate: allergies, current medications, past family history, past medical history, past social history, past surgical history and problem list.  Current Outpatient Medications  Medication Sig Dispense Refill  . albuterol (VENTOLIN HFA) 108 (90 Base) MCG/ACT  inhaler Inhale 2 puffs into the lungs every 6 (six) hours as needed for wheezing. 18 g 5  . Budeson-Glycopyrrol-Formoterol (BREZTRI AEROSPHERE) 160-9-4.8 MCG/ACT AERO Inhale  2 puffs into the lungs 2 (two) times daily. 10.7 g 5  . DUPIXENT 300 MG/2ML prefilled syringe INJECT 2 SYRINGES (600MG ) UNDER THE SKIN ON DAY 1, THEN INJECT 1 SYRINGE (300MG ) ON DAY 15 , THEN 1 SYRINGE EVERY OTHER WEEK THEREAFTER. 4 mL 11  . Fluticasone Propionate (XHANCE) 93 MCG/ACT EXHU Place 2 sprays into both nostrils 2 (two) times daily. 32 mL 3  . ibuprofen (ADVIL,MOTRIN) 800 MG tablet TAKE 1 TABLET BY MOUTH DAILY AS NEEDED 30 tablet 0  . montelukast (SINGULAIR) 10 MG tablet Take 1 tablet (10 mg total) by mouth at bedtime. Reported on 07/05/2015 90 tablet 3  . omeprazole (PRILOSEC) 20 MG capsule Take 1 capsule (20 mg total) by mouth daily. 30 capsule 5  . fexofenadine (ALLEGRA) 180 MG tablet take 180mg  by mouth in the morning as needed for allergies (Patient not taking: Reported on 11/03/2019) 30 tablet 3   Current Facility-Administered Medications  Medication Dose Route Frequency Provider Last Rate Last Admin  . dupilumab (DUPIXENT) prefilled syringe 300 mg  300 mg Subcutaneous Q14 Days Saphire Barnhart, 09/04/2015, MD   300 mg at 10/28/19 11/05/2019    Allergies  Allergen Reactions  . Acetaminophen   . Codeine Other (See Comments)    hallucinations  . Hydrocodone Other (See Comments)    hallucintatons  . Latex Itching   Review of systems: Review of systems negative except as noted in HPI / PMHx.  Past Medical History:  Diagnosis Date  . Asthma   . Asthma due to environmental allergies   . GERD (gastroesophageal reflux disease)     Family History  Problem Relation Age of Onset  . Allergic rhinitis Sister     Social History   Socioeconomic History  . Marital status: Single    Spouse name: Not on file  . Number of children: Not on file  . Years of education: Not on file  . Highest education level: Not on file  Occupational History  . Not on file  Tobacco Use  . Smoking status: Never Smoker  . Smokeless tobacco: Never Used  Vaping Use  . Vaping Use: Never used  Substance and  Sexual Activity  . Alcohol use: Yes  . Drug use: No  . Sexual activity: Not on file  Other Topics Concern  . Not on file  Social History Narrative  . Not on file   Social Determinants of Health   Financial Resource Strain:   . Difficulty of Paying Living Expenses: Not on file  Food Insecurity:   . Worried About Heywood Iles in the Last Year: Not on file  . Ran Out of Food in the Last Year: Not on file  Transportation Needs:   . Lack of Transportation (Medical): Not on file  . Lack of Transportation (Non-Medical): Not on file  Physical Activity:   . Days of Exercise per Week: Not on file  . Minutes of Exercise per Session: Not on file  Stress:   . Feeling of Stress : Not on file  Social Connections:   . Frequency of Communication with Friends and Family: Not on file  . Frequency of Social Gatherings with Friends and Family: Not on file  . Attends Religious Services: Not on file  . Active Member of Clubs or Organizations: Not on file  . Attends  Club or Organization Meetings: Not on file  . Marital Status: Not on file  Intimate Partner Violence:   . Fear of Current or Ex-Partner: Not on file  . Emotionally Abused: Not on file  . Physically Abused: Not on file  . Sexually Abused: Not on file    I appreciate the opportunity to take part in Donna Case's care. Please do not hesitate to contact me with questions.  Sincerely,   R. Jorene Guest, MD

## 2019-11-03 NOTE — Assessment & Plan Note (Addendum)
Stable.  Continue BrezTri 160, 2 inhalations twice daily.  I have encouraged the consistent use of a spacer device with this controller medication.  Continue Dupixent injections every 2 weeks as prescribed, montelukast 10 mg daily at bedtime, and albuterol HFA, 1 to 2 inhalations every 4-6 hours if needed.  If subjective and objective measures of pulmonary function remain stable, we will consider stepping down therapy on the next visit.

## 2019-11-03 NOTE — Patient Instructions (Addendum)
Moderate persistent asthma Stable.  Continue BrezTri 160, 2 inhalations twice daily.  I have encouraged the consistent use of a spacer device with this controller medication.  Continue Dupixent injections every 2 weeks as prescribed, montelukast 10 mg daily at bedtime, and albuterol HFA, 1 to 2 inhalations every 4-6 hours if needed.  If subjective and objective measures of pulmonary function remain stable, we will consider stepping down therapy on the next visit.  Allergic rhinitis  Continue appropriate allergen avoidance measures, aeroallergen immunotherapy injections, and Xhance.  Nasal saline spray (i.e., Simply Saline) or nasal saline lavage (i.e., NeilMed) is recommended as needed and prior to medicated nasal sprays.  Polyp of nasal cavity  Continue Dupixent injections as prescribed.  Xhance, 1-2 spray per nostril daily.  Nasal saline spray (i.e. Simply Saline) is recommended prior to medicated nasal sprays and as needed.   Return in about 4 months (around 03/04/2020), or if symptoms worsen or fail to improve.

## 2019-11-03 NOTE — Assessment & Plan Note (Signed)
   Continue appropriate allergen avoidance measures, aeroallergen immunotherapy injections, and Xhance.  Nasal saline spray (i.e., Simply Saline) or nasal saline lavage (i.e., NeilMed) is recommended as needed and prior to medicated nasal sprays.

## 2019-11-03 NOTE — Assessment & Plan Note (Signed)
   Continue Dupixent injections as prescribed.  Xhance, 1-2 spray per nostril daily.  Nasal saline spray (i.e. Simply Saline) is recommended prior to medicated nasal sprays and as needed.

## 2019-11-06 ENCOUNTER — Ambulatory Visit (INDEPENDENT_AMBULATORY_CARE_PROVIDER_SITE_OTHER): Payer: BC Managed Care – PPO

## 2019-11-06 DIAGNOSIS — J309 Allergic rhinitis, unspecified: Secondary | ICD-10-CM

## 2019-11-11 ENCOUNTER — Ambulatory Visit (INDEPENDENT_AMBULATORY_CARE_PROVIDER_SITE_OTHER): Payer: BC Managed Care – PPO

## 2019-11-11 ENCOUNTER — Other Ambulatory Visit: Payer: Self-pay

## 2019-11-11 DIAGNOSIS — J4550 Severe persistent asthma, uncomplicated: Secondary | ICD-10-CM

## 2019-11-25 ENCOUNTER — Other Ambulatory Visit: Payer: Self-pay

## 2019-11-25 ENCOUNTER — Ambulatory Visit (INDEPENDENT_AMBULATORY_CARE_PROVIDER_SITE_OTHER): Payer: BC Managed Care – PPO | Admitting: *Deleted

## 2019-11-25 DIAGNOSIS — J4550 Severe persistent asthma, uncomplicated: Secondary | ICD-10-CM

## 2019-11-28 ENCOUNTER — Ambulatory Visit (INDEPENDENT_AMBULATORY_CARE_PROVIDER_SITE_OTHER): Payer: BC Managed Care – PPO | Admitting: *Deleted

## 2019-11-28 DIAGNOSIS — J309 Allergic rhinitis, unspecified: Secondary | ICD-10-CM | POA: Diagnosis not present

## 2019-12-09 ENCOUNTER — Ambulatory Visit (INDEPENDENT_AMBULATORY_CARE_PROVIDER_SITE_OTHER): Payer: BC Managed Care – PPO | Admitting: *Deleted

## 2019-12-09 ENCOUNTER — Other Ambulatory Visit: Payer: Self-pay

## 2019-12-09 DIAGNOSIS — J4550 Severe persistent asthma, uncomplicated: Secondary | ICD-10-CM

## 2019-12-12 ENCOUNTER — Ambulatory Visit (INDEPENDENT_AMBULATORY_CARE_PROVIDER_SITE_OTHER): Payer: BC Managed Care – PPO

## 2019-12-12 DIAGNOSIS — J309 Allergic rhinitis, unspecified: Secondary | ICD-10-CM

## 2019-12-23 ENCOUNTER — Ambulatory Visit: Payer: Self-pay

## 2019-12-25 ENCOUNTER — Ambulatory Visit (INDEPENDENT_AMBULATORY_CARE_PROVIDER_SITE_OTHER): Payer: BC Managed Care – PPO | Admitting: *Deleted

## 2019-12-25 ENCOUNTER — Other Ambulatory Visit: Payer: Self-pay

## 2019-12-25 DIAGNOSIS — J4550 Severe persistent asthma, uncomplicated: Secondary | ICD-10-CM | POA: Diagnosis not present

## 2020-01-02 ENCOUNTER — Ambulatory Visit (INDEPENDENT_AMBULATORY_CARE_PROVIDER_SITE_OTHER): Payer: BC Managed Care – PPO

## 2020-01-02 DIAGNOSIS — J309 Allergic rhinitis, unspecified: Secondary | ICD-10-CM | POA: Diagnosis not present

## 2020-01-08 ENCOUNTER — Ambulatory Visit (INDEPENDENT_AMBULATORY_CARE_PROVIDER_SITE_OTHER): Payer: BC Managed Care – PPO | Admitting: *Deleted

## 2020-01-08 ENCOUNTER — Other Ambulatory Visit: Payer: Self-pay

## 2020-01-08 DIAGNOSIS — J4550 Severe persistent asthma, uncomplicated: Secondary | ICD-10-CM | POA: Diagnosis not present

## 2020-01-21 ENCOUNTER — Other Ambulatory Visit: Payer: Self-pay | Admitting: Allergy and Immunology

## 2020-01-26 ENCOUNTER — Other Ambulatory Visit: Payer: Self-pay

## 2020-01-26 ENCOUNTER — Ambulatory Visit (INDEPENDENT_AMBULATORY_CARE_PROVIDER_SITE_OTHER): Payer: BC Managed Care – PPO

## 2020-01-26 DIAGNOSIS — J4550 Severe persistent asthma, uncomplicated: Secondary | ICD-10-CM | POA: Diagnosis not present

## 2020-01-28 ENCOUNTER — Ambulatory Visit (INDEPENDENT_AMBULATORY_CARE_PROVIDER_SITE_OTHER): Payer: BC Managed Care – PPO | Admitting: *Deleted

## 2020-01-28 DIAGNOSIS — J309 Allergic rhinitis, unspecified: Secondary | ICD-10-CM

## 2020-02-02 NOTE — Progress Notes (Signed)
EXP 02/03/21 

## 2020-02-04 ENCOUNTER — Other Ambulatory Visit (INDEPENDENT_AMBULATORY_CARE_PROVIDER_SITE_OTHER): Payer: Self-pay | Admitting: Orthopaedic Surgery

## 2020-02-04 NOTE — Telephone Encounter (Signed)
Please advise 

## 2020-02-05 DIAGNOSIS — J301 Allergic rhinitis due to pollen: Secondary | ICD-10-CM

## 2020-02-09 ENCOUNTER — Other Ambulatory Visit: Payer: Self-pay | Admitting: Allergy and Immunology

## 2020-02-10 ENCOUNTER — Other Ambulatory Visit: Payer: Self-pay | Admitting: Allergy and Immunology

## 2020-02-10 ENCOUNTER — Ambulatory Visit: Payer: Self-pay

## 2020-02-13 ENCOUNTER — Other Ambulatory Visit: Payer: Self-pay

## 2020-02-13 ENCOUNTER — Ambulatory Visit: Payer: Self-pay

## 2020-02-13 ENCOUNTER — Ambulatory Visit (INDEPENDENT_AMBULATORY_CARE_PROVIDER_SITE_OTHER): Payer: BC Managed Care – PPO | Admitting: *Deleted

## 2020-02-13 DIAGNOSIS — Z23 Encounter for immunization: Secondary | ICD-10-CM

## 2020-02-13 NOTE — Progress Notes (Signed)
   Covid-19 Vaccination Clinic  Name:  Donna Case    MRN: [id number] DOB: [birth year]  02/13/2020  [name] was observed post Covid-19 immunization for 15 minutes without incident. She was provided with Vaccine Information Sheet and instruction to access the V-Safe system.   [name] was instructed to call [phone] with any severe reactions post vaccine: Marland Kitchen Difficulty breathing  . Swelling of face and throat  . A fast heartbeat  . A bad rash all over body  . Dizziness and weakness

## 2020-02-18 ENCOUNTER — Ambulatory Visit (INDEPENDENT_AMBULATORY_CARE_PROVIDER_SITE_OTHER): Payer: BC Managed Care – PPO

## 2020-02-18 DIAGNOSIS — Z23 Encounter for immunization: Secondary | ICD-10-CM | POA: Diagnosis not present

## 2020-02-23 ENCOUNTER — Other Ambulatory Visit: Payer: Self-pay

## 2020-02-25 ENCOUNTER — Ambulatory Visit (INDEPENDENT_AMBULATORY_CARE_PROVIDER_SITE_OTHER): Payer: BC Managed Care – PPO

## 2020-02-25 DIAGNOSIS — J4550 Severe persistent asthma, uncomplicated: Secondary | ICD-10-CM | POA: Diagnosis not present

## 2020-03-03 ENCOUNTER — Ambulatory Visit (INDEPENDENT_AMBULATORY_CARE_PROVIDER_SITE_OTHER): Payer: BC Managed Care – PPO

## 2020-03-03 DIAGNOSIS — J309 Allergic rhinitis, unspecified: Secondary | ICD-10-CM | POA: Diagnosis not present

## 2020-03-04 ENCOUNTER — Encounter: Payer: Self-pay | Admitting: Allergy and Immunology

## 2020-03-04 ENCOUNTER — Other Ambulatory Visit: Payer: Self-pay

## 2020-03-04 ENCOUNTER — Ambulatory Visit: Payer: BC Managed Care – PPO | Admitting: Allergy and Immunology

## 2020-03-04 VITALS — BP 120/70 | HR 72 | Temp 98.4°F | Resp 16 | Ht 72.0 in | Wt 198.4 lb

## 2020-03-04 DIAGNOSIS — J3089 Other allergic rhinitis: Secondary | ICD-10-CM | POA: Diagnosis not present

## 2020-03-04 DIAGNOSIS — J4540 Moderate persistent asthma, uncomplicated: Secondary | ICD-10-CM | POA: Diagnosis not present

## 2020-03-04 DIAGNOSIS — J330 Polyp of nasal cavity: Secondary | ICD-10-CM

## 2020-03-04 MED ORDER — FAMOTIDINE 20 MG PO TABS: ORAL_TABLET | ORAL | 1 refills | Status: DC

## 2020-03-04 MED ORDER — OMEPRAZOLE 20 MG PO CPDR: DELAYED_RELEASE_CAPSULE | ORAL | 1 refills | Status: DC

## 2020-03-04 MED ORDER — MONTELUKAST SODIUM 10 MG PO TABS: 10.0000 mg | ORAL_TABLET | Freq: Every day | ORAL | 1 refills | Status: DC

## 2020-03-04 MED ORDER — FEXOFENADINE HCL 180 MG PO TABS: ORAL_TABLET | ORAL | 1 refills | Status: DC

## 2020-03-04 MED ORDER — XHANCE 93 MCG/ACT NA EXHU: 2.0000 | INHALANT_SUSPENSION | Freq: Two times a day (BID) | NASAL | 1 refills | Status: DC

## 2020-03-04 MED ORDER — ALBUTEROL SULFATE HFA 108 (90 BASE) MCG/ACT IN AERS: 2.0000 | INHALATION_SPRAY | Freq: Four times a day (QID) | RESPIRATORY_TRACT | 1 refills | Status: DC | PRN

## 2020-03-04 MED ORDER — BREZTRI AEROSPHERE 160-9-4.8 MCG/ACT IN AERO: 1.0000 | INHALATION_SPRAY | Freq: Two times a day (BID) | RESPIRATORY_TRACT | 1 refills | Status: DC

## 2020-03-04 NOTE — Assessment & Plan Note (Signed)
Well-controlled on Dupixent.  Continue Dupixent as prescribed.  During upper respiratory tract infections and asthma flares, add BrezTri 2 inhalations via spacer device twice daily until symptoms have returned to baseline.  Continue albuterol HFA, 1 to 2 inhalations every 4-6 hours if needed.  Subjective and objective measures of pulmonary function will be followed and the treatment plan will be adjusted accordingly.

## 2020-03-04 NOTE — Assessment & Plan Note (Signed)
   Continue Dupixent injections as prescribed.  Xhance, 1-2 spray per nostril daily.  Nasal saline spray (i.e. Simply Saline) is recommended prior to medicated nasal sprays and as needed.

## 2020-03-04 NOTE — Patient Instructions (Addendum)
Moderate persistent asthma Well-controlled on Dupixent.  Continue Dupixent as prescribed.  During upper respiratory tract infections and asthma flares, add BrezTri 2 inhalations via spacer device twice daily until symptoms have returned to baseline.  Continue albuterol HFA, 1 to 2 inhalations every 4-6 hours if needed.  Subjective and objective measures of pulmonary function will be followed and the treatment plan will be adjusted accordingly.  Allergic rhinitis  Continue appropriate allergen avoidance measures.  Continue aeroallergen immunotherapy per protocol.  Xhance, 1 to 2 sprays per nostril 1-2 times daily if needed.  Nasal saline spray (i.e., Simply Saline) or nasal saline lavage (i.e., NeilMed) is recommended as needed and prior to medicated nasal sprays.  Polyp of nasal cavity  Continue Dupixent injections as prescribed.  Xhance, 1-2 spray per nostril daily.  Nasal saline spray (i.e. Simply Saline) is recommended prior to medicated nasal sprays and as needed.   Return in about 6 months (around 09/02/2020), or if symptoms worsen or fail to improve.

## 2020-03-04 NOTE — Assessment & Plan Note (Signed)
   Continue appropriate allergen avoidance measures.  Continue aeroallergen immunotherapy per protocol.  Xhance, 1 to 2 sprays per nostril 1-2 times daily if needed.  Nasal saline spray (i.e., Simply Saline) or nasal saline lavage (i.e., NeilMed) is recommended as needed and prior to medicated nasal sprays.

## 2020-03-04 NOTE — Progress Notes (Signed)
Follow-up Note  RE: Donna Case MRN: [id number] DOB: Jul 27, 1960 Date of Office Visit: 03/04/2020  Primary care provider: Deveron Furlong, MD Referring provider: Haig Prophet*  History of present illness: Donna Case is a 59 y.o. female with persistent asthma, nasal polyps, and allergic rhinitis presented today for follow-up.  She was last seen in this clinic on November 03, 2019.  She reports that her asthma and allergy symptoms have been well controlled in the interval since her previous visit while taking Dupixent injections as prescribed and allergen immunotherapy injections per protocol.  She rarely requires albuterol rescue and does not experience limitations in normal daily activities or nocturnal awakenings due to lower respiratory symptoms.  She currently is not using BrezTri on a regular basis, but will use it if she has an asthma flare.  Overall, she reports that she is doing well and has no new problems or complaints today.  Assessment and plan: Moderate persistent asthma Well-controlled on Dupixent.  Continue Dupixent as prescribed.  During upper respiratory tract infections and asthma flares, add BrezTri 2 inhalations via spacer device twice daily until symptoms have returned to baseline.  Continue albuterol HFA, 1 to 2 inhalations every 4-6 hours if needed.  Subjective and objective measures of pulmonary function will be followed and the treatment plan will be adjusted accordingly.  Allergic rhinitis  Continue appropriate allergen avoidance measures.  Continue aeroallergen immunotherapy per protocol.  Xhance, 1 to 2 sprays per nostril 1-2 times daily if needed.  Nasal saline spray (i.e., Simply Saline) or nasal saline lavage (i.e., NeilMed) is recommended as needed and prior to medicated nasal sprays.  Polyp of nasal cavity  Continue Dupixent injections as prescribed.  Xhance, 1-2 spray per nostril daily.  Nasal saline spray (i.e. Simply  Saline) is recommended prior to medicated nasal sprays and as needed.   Meds ordered this encounter  Medications  . albuterol (VENTOLIN HFA) 108 (90 Base) MCG/ACT inhaler    Sig: Inhale 2 puffs into the lungs every 6 (six) hours as needed for wheezing.    Dispense:  18 g    Refill:  1  . Budeson-Glycopyrrol-Formoterol (BREZTRI AEROSPHERE) 160-9-4.8 MCG/ACT AERO    Sig: Inhale 1 puff into the lungs 2 (two) times daily.    Dispense:  32.1 g    Refill:  1    Please dispense 90 day supply  . famotidine (PEPCID) 20 MG tablet    Sig: Take 1 tablet twice daily.    Dispense:  180 tablet    Refill:  1    Dispense 90 day supply  . fexofenadine (ALLEGRA) 180 MG tablet    Sig: take 180mg  by mouth in the morning as needed for allergies    Dispense:  90 tablet    Refill:  1    Dispense 90 day supply.  . Fluticasone Propionate (XHANCE) 93 MCG/ACT EXHU    Sig: Place 2 sprays into both nostrils 2 (two) times daily.    Dispense:  48 mL    Refill:  1    Dispense 90 day supply  . montelukast (SINGULAIR) 10 MG tablet    Sig: Take 1 tablet (10 mg total) by mouth at bedtime.    Dispense:  90 tablet    Refill:  1    Dispense 90 day supply  . omeprazole (PRILOSEC) 20 MG capsule    Sig: Take one capsule once daily.    Dispense:  90 capsule    Refill:  1  Diagnostics: Due to Covid precautions, spirometry was not performed today as the patient's symptoms are reported as well controlled and pulmonary exam was normal.    Physical examination: Blood pressure 120/70, pulse 72, temperature 98.4 F (36.9 C), temperature source Tympanic, resp. rate 16, height 6' (1.829 m), weight 198 lb 6.4 oz (90 kg), last menstrual period 07/13/2012, SpO2 100 %.  General: Alert, interactive, in no acute distress. HEENT: TMs pearly gray, turbinates minimally edematous without discharge, post-pharynx unremarkable. Neck: Supple without lymphadenopathy. Lungs: Clear to auscultation without wheezing, rhonchi or  rales. CV: Normal S1, S2 without murmurs. Skin: Warm and dry, without lesions or rashes.  The following portions of the patient's history were reviewed and updated as appropriate: allergies, current medications, past family history, past medical history, past social history, past surgical history and problem list.  Current Outpatient Medications  Medication Sig Dispense Refill  . ergocalciferol (VITAMIN D2) 1.25 MG (50000 UT) capsule Take by mouth.    Marland Kitchen ibuprofen (ADVIL) 800 MG tablet TAKE 1 TABLET BY MOUTH DAILY AS NEEDED 30 tablet 0  . NON FORMULARY 2 injections every week and then at 0.50 cc every 3 wks.    Marland Kitchen albuterol (VENTOLIN HFA) 108 (90 Base) MCG/ACT inhaler Inhale 2 puffs into the lungs every 6 (six) hours as needed for wheezing. 18 g 1  . Budeson-Glycopyrrol-Formoterol (BREZTRI AEROSPHERE) 160-9-4.8 MCG/ACT AERO Inhale 1 puff into the lungs 2 (two) times daily. 32.1 g 1  . famotidine (PEPCID) 20 MG tablet Take 1 tablet twice daily. 180 tablet 1  . fexofenadine (ALLEGRA) 180 MG tablet take 180mg  by mouth in the morning as needed for allergies 90 tablet 1  . Fluticasone Propionate (XHANCE) 93 MCG/ACT EXHU Place 2 sprays into both nostrils 2 (two) times daily. 48 mL 1  . montelukast (SINGULAIR) 10 MG tablet Take 1 tablet (10 mg total) by mouth at bedtime. 90 tablet 1  . omeprazole (PRILOSEC) 20 MG capsule Take one capsule once daily. 90 capsule 1   Current Facility-Administered Medications  Medication Dose Route Frequency Provider Last Rate Last Admin  . dupilumab (DUPIXENT) prefilled syringe 300 mg  300 mg Subcutaneous Q14 Days Alexanderjames Berg, , MD   300 mg at 02/25/20 1331    Allergies  Allergen Reactions  . Acetaminophen   . Codeine Other (See Comments)    hallucinations  . Hydrocodone Other (See Comments)    hallucintatons  . Latex Itching    I appreciate the opportunity to take part in Elisa's care. Please do not hesitate to contact me with  questions.  Sincerely,   R. 02/27/20, MD

## 2020-03-11 ENCOUNTER — Ambulatory Visit (INDEPENDENT_AMBULATORY_CARE_PROVIDER_SITE_OTHER): Payer: BC Managed Care – PPO

## 2020-03-11 ENCOUNTER — Other Ambulatory Visit: Payer: Self-pay

## 2020-03-11 DIAGNOSIS — J4540 Moderate persistent asthma, uncomplicated: Secondary | ICD-10-CM

## 2020-03-19 ENCOUNTER — Ambulatory Visit (INDEPENDENT_AMBULATORY_CARE_PROVIDER_SITE_OTHER): Payer: BC Managed Care – PPO | Admitting: *Deleted

## 2020-03-19 DIAGNOSIS — J309 Allergic rhinitis, unspecified: Secondary | ICD-10-CM | POA: Diagnosis not present

## 2020-03-25 ENCOUNTER — Other Ambulatory Visit: Payer: Self-pay

## 2020-03-25 ENCOUNTER — Ambulatory Visit (INDEPENDENT_AMBULATORY_CARE_PROVIDER_SITE_OTHER): Payer: BC Managed Care – PPO

## 2020-03-25 DIAGNOSIS — J4540 Moderate persistent asthma, uncomplicated: Secondary | ICD-10-CM

## 2020-04-06 ENCOUNTER — Ambulatory Visit (INDEPENDENT_AMBULATORY_CARE_PROVIDER_SITE_OTHER): Payer: BC Managed Care – PPO

## 2020-04-06 DIAGNOSIS — J309 Allergic rhinitis, unspecified: Secondary | ICD-10-CM | POA: Diagnosis not present

## 2020-04-08 ENCOUNTER — Ambulatory Visit (INDEPENDENT_AMBULATORY_CARE_PROVIDER_SITE_OTHER): Payer: BC Managed Care – PPO

## 2020-04-08 ENCOUNTER — Other Ambulatory Visit: Payer: Self-pay

## 2020-04-08 DIAGNOSIS — J4540 Moderate persistent asthma, uncomplicated: Secondary | ICD-10-CM | POA: Diagnosis not present

## 2020-04-13 ENCOUNTER — Ambulatory Visit (INDEPENDENT_AMBULATORY_CARE_PROVIDER_SITE_OTHER): Payer: BC Managed Care – PPO | Admitting: *Deleted

## 2020-04-13 DIAGNOSIS — J309 Allergic rhinitis, unspecified: Secondary | ICD-10-CM | POA: Diagnosis not present

## 2020-04-20 ENCOUNTER — Ambulatory Visit (INDEPENDENT_AMBULATORY_CARE_PROVIDER_SITE_OTHER): Payer: BC Managed Care – PPO | Admitting: *Deleted

## 2020-04-20 DIAGNOSIS — J309 Allergic rhinitis, unspecified: Secondary | ICD-10-CM | POA: Diagnosis not present

## 2020-04-23 ENCOUNTER — Ambulatory Visit: Payer: Self-pay

## 2020-04-26 ENCOUNTER — Ambulatory Visit: Payer: Self-pay

## 2020-05-05 ENCOUNTER — Ambulatory Visit (INDEPENDENT_AMBULATORY_CARE_PROVIDER_SITE_OTHER): Payer: BC Managed Care – PPO | Admitting: *Deleted

## 2020-05-05 ENCOUNTER — Other Ambulatory Visit: Payer: Self-pay

## 2020-05-05 DIAGNOSIS — J4540 Moderate persistent asthma, uncomplicated: Secondary | ICD-10-CM | POA: Diagnosis not present

## 2020-05-06 ENCOUNTER — Other Ambulatory Visit (INDEPENDENT_AMBULATORY_CARE_PROVIDER_SITE_OTHER): Payer: Self-pay | Admitting: Orthopaedic Surgery

## 2020-05-07 ENCOUNTER — Ambulatory Visit: Payer: Self-pay

## 2020-05-07 ENCOUNTER — Ambulatory Visit (INDEPENDENT_AMBULATORY_CARE_PROVIDER_SITE_OTHER): Payer: BC Managed Care – PPO | Admitting: *Deleted

## 2020-05-07 DIAGNOSIS — J309 Allergic rhinitis, unspecified: Secondary | ICD-10-CM

## 2020-05-07 NOTE — Telephone Encounter (Signed)
Can you advise? Dr Ophelia Charter off today

## 2020-05-11 ENCOUNTER — Ambulatory Visit: Payer: Self-pay

## 2020-05-13 ENCOUNTER — Ambulatory Visit (INDEPENDENT_AMBULATORY_CARE_PROVIDER_SITE_OTHER): Payer: BC Managed Care – PPO

## 2020-05-13 DIAGNOSIS — J309 Allergic rhinitis, unspecified: Secondary | ICD-10-CM

## 2020-05-19 ENCOUNTER — Other Ambulatory Visit: Payer: Self-pay

## 2020-05-19 ENCOUNTER — Ambulatory Visit (INDEPENDENT_AMBULATORY_CARE_PROVIDER_SITE_OTHER): Payer: BC Managed Care – PPO

## 2020-05-19 DIAGNOSIS — J4550 Severe persistent asthma, uncomplicated: Secondary | ICD-10-CM | POA: Diagnosis not present

## 2020-05-21 ENCOUNTER — Ambulatory Visit (INDEPENDENT_AMBULATORY_CARE_PROVIDER_SITE_OTHER): Payer: BC Managed Care – PPO | Admitting: *Deleted

## 2020-05-21 DIAGNOSIS — J309 Allergic rhinitis, unspecified: Secondary | ICD-10-CM

## 2020-06-02 ENCOUNTER — Ambulatory Visit (INDEPENDENT_AMBULATORY_CARE_PROVIDER_SITE_OTHER): Payer: BC Managed Care – PPO | Admitting: *Deleted

## 2020-06-02 DIAGNOSIS — J4550 Severe persistent asthma, uncomplicated: Secondary | ICD-10-CM

## 2020-06-11 ENCOUNTER — Ambulatory Visit (INDEPENDENT_AMBULATORY_CARE_PROVIDER_SITE_OTHER): Payer: BC Managed Care – PPO

## 2020-06-11 DIAGNOSIS — J309 Allergic rhinitis, unspecified: Secondary | ICD-10-CM | POA: Diagnosis not present

## 2020-06-17 ENCOUNTER — Ambulatory Visit (INDEPENDENT_AMBULATORY_CARE_PROVIDER_SITE_OTHER): Payer: BC Managed Care – PPO | Admitting: *Deleted

## 2020-06-17 ENCOUNTER — Other Ambulatory Visit: Payer: Self-pay

## 2020-06-17 DIAGNOSIS — J4550 Severe persistent asthma, uncomplicated: Secondary | ICD-10-CM

## 2020-07-01 ENCOUNTER — Other Ambulatory Visit: Payer: Self-pay

## 2020-07-01 ENCOUNTER — Ambulatory Visit (INDEPENDENT_AMBULATORY_CARE_PROVIDER_SITE_OTHER): Payer: BC Managed Care – PPO | Admitting: *Deleted

## 2020-07-01 DIAGNOSIS — J4550 Severe persistent asthma, uncomplicated: Secondary | ICD-10-CM

## 2020-07-08 ENCOUNTER — Ambulatory Visit (INDEPENDENT_AMBULATORY_CARE_PROVIDER_SITE_OTHER): Payer: BC Managed Care – PPO | Admitting: *Deleted

## 2020-07-08 DIAGNOSIS — J309 Allergic rhinitis, unspecified: Secondary | ICD-10-CM | POA: Diagnosis not present

## 2020-07-15 ENCOUNTER — Ambulatory Visit: Payer: Self-pay

## 2020-07-15 ENCOUNTER — Telehealth: Payer: Self-pay

## 2020-07-15 NOTE — Telephone Encounter (Signed)
Patient has been rescheduled.

## 2020-07-15 NOTE — Telephone Encounter (Signed)
Patient called to reschedule her Dupixent injection to another day with a 9am slot. She is unable to make it at 5pm today (07/15/20).

## 2020-07-16 ENCOUNTER — Ambulatory Visit (INDEPENDENT_AMBULATORY_CARE_PROVIDER_SITE_OTHER): Payer: BC Managed Care – PPO

## 2020-07-16 DIAGNOSIS — J4550 Severe persistent asthma, uncomplicated: Secondary | ICD-10-CM | POA: Diagnosis not present

## 2020-07-20 ENCOUNTER — Other Ambulatory Visit: Payer: Self-pay

## 2020-07-22 ENCOUNTER — Ambulatory Visit (INDEPENDENT_AMBULATORY_CARE_PROVIDER_SITE_OTHER): Payer: BC Managed Care – PPO

## 2020-07-22 DIAGNOSIS — J309 Allergic rhinitis, unspecified: Secondary | ICD-10-CM

## 2020-07-30 ENCOUNTER — Ambulatory Visit: Payer: Self-pay

## 2020-08-04 ENCOUNTER — Ambulatory Visit: Payer: Self-pay

## 2020-08-12 ENCOUNTER — Other Ambulatory Visit: Payer: Self-pay

## 2020-08-12 ENCOUNTER — Ambulatory Visit (INDEPENDENT_AMBULATORY_CARE_PROVIDER_SITE_OTHER): Payer: BC Managed Care – PPO | Admitting: *Deleted

## 2020-08-12 DIAGNOSIS — J4550 Severe persistent asthma, uncomplicated: Secondary | ICD-10-CM

## 2020-08-16 ENCOUNTER — Ambulatory Visit (INDEPENDENT_AMBULATORY_CARE_PROVIDER_SITE_OTHER): Payer: BC Managed Care – PPO

## 2020-08-16 DIAGNOSIS — J309 Allergic rhinitis, unspecified: Secondary | ICD-10-CM | POA: Diagnosis not present

## 2020-08-16 NOTE — Progress Notes (Signed)
EXP  08/18/21 

## 2020-08-18 DIAGNOSIS — J301 Allergic rhinitis due to pollen: Secondary | ICD-10-CM

## 2020-08-19 ENCOUNTER — Other Ambulatory Visit: Payer: Self-pay

## 2020-08-19 MED ORDER — XHANCE 93 MCG/ACT NA EXHU: 2.0000 | INHALANT_SUSPENSION | Freq: Two times a day (BID) | NASAL | 0 refills | Status: DC

## 2020-08-26 ENCOUNTER — Ambulatory Visit (INDEPENDENT_AMBULATORY_CARE_PROVIDER_SITE_OTHER): Payer: BC Managed Care – PPO | Admitting: *Deleted

## 2020-08-26 ENCOUNTER — Other Ambulatory Visit: Payer: Self-pay

## 2020-08-26 DIAGNOSIS — J4550 Severe persistent asthma, uncomplicated: Secondary | ICD-10-CM | POA: Diagnosis not present

## 2020-09-09 ENCOUNTER — Other Ambulatory Visit: Payer: Self-pay

## 2020-09-09 ENCOUNTER — Ambulatory Visit (INDEPENDENT_AMBULATORY_CARE_PROVIDER_SITE_OTHER): Payer: BC Managed Care – PPO | Admitting: *Deleted

## 2020-09-09 DIAGNOSIS — J4550 Severe persistent asthma, uncomplicated: Secondary | ICD-10-CM | POA: Diagnosis not present

## 2020-09-10 ENCOUNTER — Other Ambulatory Visit: Payer: Self-pay | Admitting: *Deleted

## 2020-09-23 ENCOUNTER — Ambulatory Visit (INDEPENDENT_AMBULATORY_CARE_PROVIDER_SITE_OTHER): Payer: BC Managed Care – PPO | Admitting: *Deleted

## 2020-09-23 ENCOUNTER — Other Ambulatory Visit: Payer: Self-pay

## 2020-09-23 DIAGNOSIS — J4550 Severe persistent asthma, uncomplicated: Secondary | ICD-10-CM | POA: Diagnosis not present

## 2020-09-28 ENCOUNTER — Ambulatory Visit (INDEPENDENT_AMBULATORY_CARE_PROVIDER_SITE_OTHER): Payer: BC Managed Care – PPO | Admitting: *Deleted

## 2020-09-28 DIAGNOSIS — J309 Allergic rhinitis, unspecified: Secondary | ICD-10-CM | POA: Diagnosis not present

## 2020-10-07 ENCOUNTER — Other Ambulatory Visit: Payer: Self-pay

## 2020-10-07 ENCOUNTER — Ambulatory Visit (INDEPENDENT_AMBULATORY_CARE_PROVIDER_SITE_OTHER): Payer: BC Managed Care – PPO

## 2020-10-07 DIAGNOSIS — J4550 Severe persistent asthma, uncomplicated: Secondary | ICD-10-CM

## 2020-10-21 ENCOUNTER — Other Ambulatory Visit: Payer: Self-pay

## 2020-10-21 ENCOUNTER — Ambulatory Visit (INDEPENDENT_AMBULATORY_CARE_PROVIDER_SITE_OTHER): Payer: BC Managed Care – PPO | Admitting: *Deleted

## 2020-10-21 DIAGNOSIS — J4550 Severe persistent asthma, uncomplicated: Secondary | ICD-10-CM | POA: Diagnosis not present

## 2020-10-26 ENCOUNTER — Ambulatory Visit (INDEPENDENT_AMBULATORY_CARE_PROVIDER_SITE_OTHER): Payer: BC Managed Care – PPO | Admitting: *Deleted

## 2020-10-26 DIAGNOSIS — J309 Allergic rhinitis, unspecified: Secondary | ICD-10-CM | POA: Diagnosis not present

## 2020-11-02 ENCOUNTER — Ambulatory Visit (INDEPENDENT_AMBULATORY_CARE_PROVIDER_SITE_OTHER): Payer: BC Managed Care – PPO | Admitting: *Deleted

## 2020-11-02 DIAGNOSIS — J309 Allergic rhinitis, unspecified: Secondary | ICD-10-CM

## 2020-11-11 ENCOUNTER — Ambulatory Visit (INDEPENDENT_AMBULATORY_CARE_PROVIDER_SITE_OTHER): Payer: BC Managed Care – PPO

## 2020-11-11 ENCOUNTER — Ambulatory Visit: Payer: BC Managed Care – PPO

## 2020-11-11 DIAGNOSIS — J4540 Moderate persistent asthma, uncomplicated: Secondary | ICD-10-CM

## 2020-11-11 DIAGNOSIS — J4550 Severe persistent asthma, uncomplicated: Secondary | ICD-10-CM | POA: Diagnosis not present

## 2020-11-17 ENCOUNTER — Ambulatory Visit (INDEPENDENT_AMBULATORY_CARE_PROVIDER_SITE_OTHER): Payer: BC Managed Care – PPO

## 2020-11-17 DIAGNOSIS — J309 Allergic rhinitis, unspecified: Secondary | ICD-10-CM | POA: Diagnosis not present

## 2020-11-22 ENCOUNTER — Other Ambulatory Visit: Payer: Self-pay

## 2020-11-23 ENCOUNTER — Other Ambulatory Visit: Payer: Self-pay

## 2020-11-25 ENCOUNTER — Ambulatory Visit: Payer: BC Managed Care – PPO

## 2020-11-25 ENCOUNTER — Telehealth: Payer: Self-pay | Admitting: *Deleted

## 2020-11-25 NOTE — Telephone Encounter (Signed)
L/m for patient needs MD appt for dupixent reapproval

## 2020-12-02 ENCOUNTER — Ambulatory Visit (INDEPENDENT_AMBULATORY_CARE_PROVIDER_SITE_OTHER): Payer: BC Managed Care – PPO

## 2020-12-02 ENCOUNTER — Other Ambulatory Visit: Payer: Self-pay

## 2020-12-02 DIAGNOSIS — J4550 Severe persistent asthma, uncomplicated: Secondary | ICD-10-CM | POA: Diagnosis not present

## 2020-12-14 ENCOUNTER — Ambulatory Visit (INDEPENDENT_AMBULATORY_CARE_PROVIDER_SITE_OTHER): Payer: BC Managed Care – PPO

## 2020-12-14 DIAGNOSIS — J309 Allergic rhinitis, unspecified: Secondary | ICD-10-CM | POA: Diagnosis not present

## 2020-12-16 ENCOUNTER — Ambulatory Visit (INDEPENDENT_AMBULATORY_CARE_PROVIDER_SITE_OTHER): Payer: BC Managed Care – PPO | Admitting: *Deleted

## 2020-12-16 ENCOUNTER — Other Ambulatory Visit: Payer: Self-pay

## 2020-12-16 DIAGNOSIS — J4550 Severe persistent asthma, uncomplicated: Secondary | ICD-10-CM

## 2020-12-21 ENCOUNTER — Encounter: Payer: Self-pay | Admitting: Allergy & Immunology

## 2020-12-21 ENCOUNTER — Ambulatory Visit: Payer: BC Managed Care – PPO | Admitting: Allergy & Immunology

## 2020-12-21 ENCOUNTER — Ambulatory Visit: Payer: Self-pay

## 2020-12-21 ENCOUNTER — Other Ambulatory Visit: Payer: Self-pay

## 2020-12-21 VITALS — BP 130/80 | HR 77 | Temp 97.6°F | Resp 18 | Ht 73.0 in | Wt 205.0 lb

## 2020-12-21 DIAGNOSIS — J330 Polyp of nasal cavity: Secondary | ICD-10-CM | POA: Diagnosis not present

## 2020-12-21 DIAGNOSIS — J4540 Moderate persistent asthma, uncomplicated: Secondary | ICD-10-CM | POA: Diagnosis not present

## 2020-12-21 DIAGNOSIS — J309 Allergic rhinitis, unspecified: Secondary | ICD-10-CM

## 2020-12-21 DIAGNOSIS — J3089 Other allergic rhinitis: Secondary | ICD-10-CM

## 2020-12-21 DIAGNOSIS — J302 Other seasonal allergic rhinitis: Secondary | ICD-10-CM | POA: Diagnosis not present

## 2020-12-21 NOTE — Patient Instructions (Addendum)
1. Moderate persistent asthma without complication - Lung testing looked great today. - It seems that you symptoms are under good control with the use of your inhalers as needed. - We can continue with that as long as your symptoms are under good control. - Communicate with [place] regarding your symptoms.   2. Polyp of nasal cavity - Continue with Xhance as needed. - We are spacing Dupixent to every 4 weeks to see if this helps to decrease the whiteheads.   3. Seasonal and perennial allergic rhinitis - Continue with the current vial and we will stop them after that. - Typically we only continue for 3-5 years anyway. - Continue with the as needed use of the allergy medications.  4. Return in about 6 months (around 06/21/2021).    Please inform us of any Emergency Department visits, hospitalizations, or changes in symptoms. Call us before going to the ED for breathing or allergy symptoms since we might be able to fit you in for a sick visit. Feel free to contact us anytime with any questions, problems, or concerns.  It was a pleasure to meet you today!  Websites that have reliable patient information: 1. American Academy of Asthma, Allergy, and Immunology: www.aaaai.org 2. Food Allergy Research and Education (FARE): foodallergy.org 3. Mothers of Asthmatics: http://www.asthmacommunitynetwork.org 4. American College of Allergy, Asthma, and Immunology: www.acaai.org   COVID-19 Vaccine Information can be found at: [email or web address] For questions related to vaccine distribution or appointments, please email [email or web address] or call [phone].   We realize that you might be concerned about having an allergic reaction to the COVID19 vaccines. To help with that concern, WE ARE OFFERING THE COVID19 VACCINES IN OUR OFFICE! Ask the front desk for dates!     "Like" [place] on Facebook and Instagram for our latest updates!      A  healthy democracy works best when [employer] participate! Make sure you are registered to vote! If you have moved or changed any of your contact information, you will need to get this updated before voting!  In some cases, you MAY be able to register to vote online: [email or web address]

## 2020-12-21 NOTE — Progress Notes (Signed)
FOLLOW UP  Date of Service/Encounter:  12/21/20   Assessment:   Moderate persistent asthma, uncomplicated  Allergic rhinitis (grasses, weeds, trees, molds, dust mite, dog, cat, cockroach) - on allergen immunotherapy with maintenance reached June 2017  Nasal polyposis - better controlled   Plan/Recommendations:   1. Moderate persistent asthma without complication - Lung testing looked great today. - It seems that you symptoms are under good control with the use of your inhalers as needed. - We can continue with that as long as your symptoms are under good control. - Communicate with [place] regarding your symptoms.   2. Polyp of nasal cavity - Continue with Xhance as needed. - We are spacing Dupixent to every 4 weeks to see if this helps to decrease the whiteheads.   3. Seasonal and perennial allergic rhinitis - Continue with the current vial and we will stop them after that. - Typically we only continue for 3-5 years anyway. - Continue with the as needed use of the allergy medications.  4. Return in about 6 months (around 06/21/2021).     Subjective:   Donna Case is a 60 y.o. female presenting today for follow up of  Chief Complaint  Patient presents with   Follow-up    Patient in today for her dupixant renewal. She is not having any major issues but she has noticed that she has a few more acne on her face since starting the injections and wants to talk about what to do about them if possible.    Donna Case has a history of the following: Patient Active Problem List   Diagnosis Date Noted   GERD (gastroesophageal reflux disease) 06/17/2018   Allergic conjunctivitis 06/17/2018   Polyp of nasal cavity 04/02/2017   Anosmia 11/27/2016   Closed fracture of right tibial plateau, with routine healing, subsequent encounter 08/15/2016   Acute sinusitis 03/05/2015   Moderate persistent asthma 11/14/2014   Allergic rhinitis 11/14/2014    History obtained from: chart  review and patient.  Donna Case is a 60 y.o. female presenting for a follow up visit.  She was last seen in December 2021.  At that time, her asthma was controlled well on Dupixent as well as Breztri 2 puffs twice daily added during flares.  For her allergic rhinitis, she was continued on Xhance as well as allergy shots.  For her nasal polyposis, she was continued on Dupixent as well as the [place].  Since the last visit, she has done very well.   Asthma/Respiratory Symptom History: She is not using her inhaler routinely. She last used it a week and a half ago. It was time for her to get her injections. Since she has had the Dupixent, her asthma has been under good control. She has not used her Breztri in quite some time. She initially started the Dupixent due to her asthma. She has not been to the ED and has not needed any prednisone at all.   Allergic Rhinitis Symptom History: She remains on the allergen immunotherapy . She reports that these have been working well. She has not needed antibiotics at all. She has a stash of May that is collecting dust at home. She has not been using it routinely. She does report that there is some pain in the right ear canal. There is some itching as well.   Donna Case is on allergen immunotherapy. She receives two injections. Immunotherapy script #1 contains trees, weeds, grasses, cat, and dog. She currently receives 0.24mL of the RED vial (  1/100). Immunotherapy script #2 contains molds, dust mites, and cockroach. She currently receives 0.49mL of the RED vial (1/100). She started shots September of 2016 and reached maintenance in June of 2017.  Prior to this, she was on them in [place] since 1999. They never gave her the option to stop her allergy shots. The addition of the Dupixent was the turning point for her symptoms. She has done very well since starting the Dupixent.  Skin Symptom History: She has been getting whiteheads nearly consistently with using the Dupixent.   She is wondering whether she can space out the Dupixent to see if she can maintain her asthma and rhinitis control while minimizing her whiteheads.   Otherwise, there have been no changes to her past medical history, surgical history, family history, or social history.    Review of Systems  Constitutional: Negative.  Negative for chills, fever, malaise/fatigue and weight loss.  HENT:  Positive for congestion. Negative for ear discharge and ear pain.        Positive for right sided ear pain.  Eyes:  Negative for pain, discharge and redness.  Respiratory:  Negative for cough, sputum production, shortness of breath and wheezing.   Cardiovascular: Negative.  Negative for chest pain and palpitations.  Gastrointestinal:  Negative for abdominal pain, constipation, diarrhea, heartburn, nausea and vomiting.  Skin: Negative.  Negative for itching and rash.  Neurological:  Negative for dizziness and headaches.  Endo/Heme/Allergies:  Negative for environmental allergies. Does not bruise/bleed easily.      Objective:   Blood pressure 130/80, pulse 77, temperature 97.6 F (36.4 C), temperature source Temporal, resp. rate 18, height 6\' 1"  (1.854 m), weight 205 lb (93 kg), last menstrual period 07/13/2012, SpO2 99 %. Body mass index is 27.05 kg/m.   Physical Exam:  Physical Exam Vitals reviewed.  Constitutional:      Appearance: She is well-developed.  HENT:     Head: Normocephalic and atraumatic.     Right Ear: Ear canal and external ear normal. A middle ear effusion is present.     Left Ear: Tympanic membrane, ear canal and external ear normal.     Nose: No nasal deformity, septal deviation, mucosal edema or rhinorrhea.     Right Turbinates: Enlarged and swollen.     Left Turbinates: Enlarged and swollen.     Right Sinus: No maxillary sinus tenderness or frontal sinus tenderness.     Left Sinus: No maxillary sinus tenderness or frontal sinus tenderness.     Comments: Nasal polyp in the  right nares obstructing around 75% of the nasal cavity.     Mouth/Throat:     Mouth: Mucous membranes are not pale and not dry.     Pharynx: Uvula midline.  Eyes:     General: Lids are normal. No allergic shiner.       Right eye: No discharge.        Left eye: No discharge.     Conjunctiva/sclera: Conjunctivae normal.     Right eye: Right conjunctiva is not injected. No chemosis.    Left eye: Left conjunctiva is not injected. No chemosis.    Pupils: Pupils are equal, round, and reactive to light.  Cardiovascular:     Rate and Rhythm: Normal rate and regular rhythm.     Heart sounds: Normal heart sounds.  Pulmonary:     Effort: Pulmonary effort is normal. No tachypnea, accessory muscle usage or respiratory distress.     Breath sounds: Normal breath sounds. No wheezing,  rhonchi or rales.     Comments: Moving air well in all lung fields. No increased work of breathing noted.  Chest:     Chest wall: No tenderness.  Lymphadenopathy:     Cervical: No cervical adenopathy.  Skin:    General: Skin is warm.     Capillary Refill: Capillary refill takes less than 2 seconds.     Coloration: Skin is not pale.     Findings: No abrasion, erythema, petechiae or rash. Rash is not papular, urticarial or vesicular.     Comments: No urticarial lesions noted.   Neurological:     Mental Status: She is alert.  Psychiatric:        Behavior: Behavior is cooperative.     Diagnostic studies:    Spirometry: results normal (FEV1: 2.17/75%, FVC: 2.78/75%, FEV1/FVC: 78%).    Spirometry consistent with normal pattern.   Allergy Studies: none        Malachi Bonds, MD  [facility]

## 2020-12-30 ENCOUNTER — Ambulatory Visit: Payer: BC Managed Care – PPO

## 2021-01-03 ENCOUNTER — Other Ambulatory Visit: Payer: Self-pay

## 2021-01-03 ENCOUNTER — Ambulatory Visit (INDEPENDENT_AMBULATORY_CARE_PROVIDER_SITE_OTHER): Payer: BC Managed Care – PPO

## 2021-01-03 DIAGNOSIS — L209 Atopic dermatitis, unspecified: Secondary | ICD-10-CM

## 2021-01-03 DIAGNOSIS — J4550 Severe persistent asthma, uncomplicated: Secondary | ICD-10-CM

## 2021-01-06 ENCOUNTER — Ambulatory Visit (INDEPENDENT_AMBULATORY_CARE_PROVIDER_SITE_OTHER): Payer: BC Managed Care – PPO | Admitting: *Deleted

## 2021-01-06 DIAGNOSIS — J309 Allergic rhinitis, unspecified: Secondary | ICD-10-CM | POA: Diagnosis not present

## 2021-01-13 ENCOUNTER — Ambulatory Visit (INDEPENDENT_AMBULATORY_CARE_PROVIDER_SITE_OTHER): Payer: BC Managed Care – PPO

## 2021-01-13 DIAGNOSIS — J309 Allergic rhinitis, unspecified: Secondary | ICD-10-CM

## 2021-01-25 ENCOUNTER — Other Ambulatory Visit: Payer: Self-pay | Admitting: *Deleted

## 2021-01-25 MED ORDER — DUPIXENT 300 MG/2ML ~~LOC~~ SOSY: 300.0000 mg | PREFILLED_SYRINGE | SUBCUTANEOUS | 11 refills | Status: DC

## 2021-02-03 ENCOUNTER — Ambulatory Visit (INDEPENDENT_AMBULATORY_CARE_PROVIDER_SITE_OTHER): Payer: BC Managed Care – PPO | Admitting: *Deleted

## 2021-02-03 ENCOUNTER — Other Ambulatory Visit: Payer: Self-pay

## 2021-02-03 DIAGNOSIS — J4550 Severe persistent asthma, uncomplicated: Secondary | ICD-10-CM

## 2021-02-07 ENCOUNTER — Other Ambulatory Visit: Payer: Self-pay

## 2021-02-07 MED ORDER — OMEPRAZOLE 20 MG PO CPDR: DELAYED_RELEASE_CAPSULE | ORAL | 1 refills | Status: DC

## 2021-02-07 NOTE — Telephone Encounter (Signed)
Pt was LOV was 10/22. Refill sent to pharmacy.

## 2021-02-08 ENCOUNTER — Other Ambulatory Visit: Payer: Self-pay

## 2021-02-08 MED ORDER — OMEPRAZOLE 20 MG PO CPDR: DELAYED_RELEASE_CAPSULE | ORAL | 1 refills | Status: DC

## 2021-02-08 NOTE — Telephone Encounter (Signed)
Sent in omeprazole 20 mg capsule with 90 day supply with 1 refill.

## 2021-02-10 ENCOUNTER — Ambulatory Visit (INDEPENDENT_AMBULATORY_CARE_PROVIDER_SITE_OTHER): Payer: BC Managed Care – PPO

## 2021-02-10 DIAGNOSIS — J309 Allergic rhinitis, unspecified: Secondary | ICD-10-CM

## 2021-02-14 ENCOUNTER — Ambulatory Visit (INDEPENDENT_AMBULATORY_CARE_PROVIDER_SITE_OTHER): Payer: BC Managed Care – PPO

## 2021-02-14 ENCOUNTER — Other Ambulatory Visit: Payer: Self-pay

## 2021-02-14 DIAGNOSIS — Z23 Encounter for immunization: Secondary | ICD-10-CM

## 2021-02-17 ENCOUNTER — Ambulatory Visit (INDEPENDENT_AMBULATORY_CARE_PROVIDER_SITE_OTHER): Payer: BC Managed Care – PPO

## 2021-02-17 DIAGNOSIS — J309 Allergic rhinitis, unspecified: Secondary | ICD-10-CM

## 2021-02-21 ENCOUNTER — Ambulatory Visit: Payer: Self-pay

## 2021-02-21 ENCOUNTER — Ambulatory Visit (INDEPENDENT_AMBULATORY_CARE_PROVIDER_SITE_OTHER): Payer: BC Managed Care – PPO

## 2021-02-21 DIAGNOSIS — Z23 Encounter for immunization: Secondary | ICD-10-CM

## 2021-03-03 ENCOUNTER — Ambulatory Visit (INDEPENDENT_AMBULATORY_CARE_PROVIDER_SITE_OTHER): Payer: BC Managed Care – PPO | Admitting: *Deleted

## 2021-03-03 ENCOUNTER — Other Ambulatory Visit: Payer: Self-pay

## 2021-03-03 DIAGNOSIS — J4550 Severe persistent asthma, uncomplicated: Secondary | ICD-10-CM | POA: Diagnosis not present

## 2021-03-08 ENCOUNTER — Ambulatory Visit (INDEPENDENT_AMBULATORY_CARE_PROVIDER_SITE_OTHER): Payer: BC Managed Care – PPO | Admitting: *Deleted

## 2021-03-08 DIAGNOSIS — J309 Allergic rhinitis, unspecified: Secondary | ICD-10-CM

## 2021-03-21 ENCOUNTER — Ambulatory Visit (INDEPENDENT_AMBULATORY_CARE_PROVIDER_SITE_OTHER): Payer: BC Managed Care – PPO

## 2021-03-21 DIAGNOSIS — J309 Allergic rhinitis, unspecified: Secondary | ICD-10-CM | POA: Diagnosis not present

## 2021-03-31 ENCOUNTER — Ambulatory Visit (INDEPENDENT_AMBULATORY_CARE_PROVIDER_SITE_OTHER): Payer: BC Managed Care – PPO

## 2021-03-31 ENCOUNTER — Other Ambulatory Visit: Payer: Self-pay

## 2021-03-31 DIAGNOSIS — J4550 Severe persistent asthma, uncomplicated: Secondary | ICD-10-CM | POA: Diagnosis not present

## 2021-04-08 ENCOUNTER — Ambulatory Visit (INDEPENDENT_AMBULATORY_CARE_PROVIDER_SITE_OTHER): Payer: BC Managed Care – PPO

## 2021-04-08 DIAGNOSIS — J309 Allergic rhinitis, unspecified: Secondary | ICD-10-CM

## 2021-04-14 ENCOUNTER — Ambulatory Visit (INDEPENDENT_AMBULATORY_CARE_PROVIDER_SITE_OTHER): Payer: BC Managed Care – PPO

## 2021-04-14 DIAGNOSIS — J309 Allergic rhinitis, unspecified: Secondary | ICD-10-CM

## 2021-04-28 ENCOUNTER — Ambulatory Visit (INDEPENDENT_AMBULATORY_CARE_PROVIDER_SITE_OTHER): Payer: BC Managed Care – PPO | Admitting: *Deleted

## 2021-04-28 ENCOUNTER — Other Ambulatory Visit: Payer: Self-pay

## 2021-04-28 DIAGNOSIS — J4550 Severe persistent asthma, uncomplicated: Secondary | ICD-10-CM

## 2021-05-26 ENCOUNTER — Other Ambulatory Visit: Payer: Self-pay

## 2021-05-26 ENCOUNTER — Ambulatory Visit (INDEPENDENT_AMBULATORY_CARE_PROVIDER_SITE_OTHER): Payer: BC Managed Care – PPO

## 2021-05-26 DIAGNOSIS — J4550 Severe persistent asthma, uncomplicated: Secondary | ICD-10-CM

## 2021-06-23 ENCOUNTER — Ambulatory Visit: Payer: BC Managed Care – PPO

## 2021-06-28 ENCOUNTER — Ambulatory Visit (INDEPENDENT_AMBULATORY_CARE_PROVIDER_SITE_OTHER): Payer: BC Managed Care – PPO | Admitting: Allergy and Immunology

## 2021-06-28 VITALS — BP 126/68 | HR 88 | Temp 97.7°F | Resp 16 | Ht 73.5 in | Wt 204.8 lb

## 2021-06-28 DIAGNOSIS — J4550 Severe persistent asthma, uncomplicated: Secondary | ICD-10-CM | POA: Diagnosis not present

## 2021-06-28 DIAGNOSIS — J339 Nasal polyp, unspecified: Secondary | ICD-10-CM | POA: Diagnosis not present

## 2021-06-28 DIAGNOSIS — J3089 Other allergic rhinitis: Secondary | ICD-10-CM

## 2021-06-28 DIAGNOSIS — J301 Allergic rhinitis due to pollen: Secondary | ICD-10-CM

## 2021-06-28 MED ORDER — ALBUTEROL SULFATE HFA 108 (90 BASE) MCG/ACT IN AERS: 2.0000 | INHALATION_SPRAY | Freq: Four times a day (QID) | RESPIRATORY_TRACT | 1 refills | Status: DC | PRN

## 2021-06-28 MED ORDER — MONTELUKAST SODIUM 10 MG PO TABS: 10.0000 mg | ORAL_TABLET | Freq: Every day | ORAL | 1 refills | Status: DC

## 2021-06-28 MED ORDER — BREZTRI AEROSPHERE 160-9-4.8 MCG/ACT IN AERO: 1.0000 | INHALATION_SPRAY | Freq: Two times a day (BID) | RESPIRATORY_TRACT | 1 refills | Status: DC

## 2021-06-28 MED ORDER — METHYLPREDNISOLONE ACETATE 80 MG/ML IJ SUSP
80.0000 mg | Freq: Once | INTRAMUSCULAR | Status: AC
  Administered 2021-06-28: 80 mg via INTRAMUSCULAR

## 2021-06-28 MED ORDER — CETIRIZINE HCL 10 MG PO TABS: 10.0000 mg | ORAL_TABLET | Freq: Two times a day (BID) | ORAL | 5 refills | Status: DC | PRN

## 2021-06-28 MED ORDER — TRIAMCINOLONE ACETONIDE 55 MCG/ACT NA AERO: 1.0000 | INHALATION_SPRAY | Freq: Two times a day (BID) | NASAL | 5 refills | Status: DC

## 2021-06-28 NOTE — Progress Notes (Signed)
? ?[facility] [employer] [facility] [employer] [facility] [place] ? ? ?Follow-up Note ? ?Referring Provider: Haig Prophet* ?Primary Provider: Deveron Furlong, MD ?Date of Office Visit: 06/28/2021 ? ?Subjective:  ? ?Donna Case (DOB: [birth year]) is a 61 y.o. female who [facility] in re-evaluation of the following: ? ?HPI: Donna Case returns to this clinic in evaluation of asthma, allergic rhinitis, nasal polyposis.  I have never seen her in this clinic and her last visit with Dr. Dellis Anes was on 21 December 2020. ? ?Currently she has very bad stuffiness of her nose and cannot really pass air through her nose and has some sneezing and some intermittent anosmia without any ugly nasal discharge or headaches or fevers.  She also has coughing and wheezing.  This started over the course of the past 2 to 3 months and has been a progressive issue. ? ?She was being treated with a multitude of different anti-inflammatory agents for her upper and lower airway inflammatory disease.  She was using dupilumab and she thought that she had an excellent response to that form of therapy.  Apparently this treatment was extended out from every 2 weeks every 4 weeks starting November 2022 and her last injection was in March 2023.  As well, she was undergoing a course of immunotherapy which she has been using since 2017 which she discontinued in January 2023. ? ?Allergies as of 06/28/2021   ? ?   Reactions  ? Acetaminophen   ? Codeine Other (See Comments)  ? hallucinations  ? Hydrocodone Other (See Comments)  ? hallucintatons  ? Latex Itching  ? ?  ? ?  ?Medication List  ? ? ?albuterol 108 (90 Base) MCG/ACT inhaler ?Commonly known as: VENTOLIN HFA ?Inhale 2 puffs into the lungs every 6 (six) hours as needed for wheezing. ?  ?Breztri Aerosphere 160-9-4.8 MCG/ACT Aero ?Generic drug: Budeson-Glycopyrrol-Formoterol ?Inhale 1 puff into the lungs 2 (two) times daily. ?  ?Dupixent 300  MG/2ML prefilled syringe ?Generic drug: dupilumab ?Inject 300 mg into the skin every 14 (fourteen) days. ?  ?ergocalciferol 1.25 MG (50000 UT) capsule ?Commonly known as: VITAMIN D2 ?Take by mouth. ?  ?famotidine 20 MG tablet ?Commonly known as: PEPCID ?Take 1 tablet twice daily. ?  ?ibuprofen 800 MG tablet ?Commonly known as: ADVIL ?TAKE 1 TABLET BY MOUTH DAILY AS NEEDED ?  ?montelukast 10 MG tablet ?Commonly known as: SINGULAIR ?Take 1 tablet (10 mg total) by mouth at bedtime. ?  ?NON FORMULARY ?2 injections every week and then at 0.50 cc every 3 wks. ?  ?omeprazole 20 MG capsule ?Commonly known as: PRILOSEC ?Take one capsule once daily for reflux. ?  ? ?Past Medical History:  ?Diagnosis Date  ? Asthma   ? Asthma due to environmental allergies   ? GERD (gastroesophageal reflux disease)   ? ? ?History reviewed. No pertinent surgical history. ? ?Review of systems negative except as noted in HPI / PMHx or noted below: ? ?Review of Systems  ?Constitutional: Negative.   ?HENT: Negative.    ?Eyes: Negative.   ?Respiratory: Negative.    ?Cardiovascular: Negative.   ?Gastrointestinal: Negative.   ?Genitourinary: Negative.   ?Musculoskeletal: Negative.   ?Skin: Negative.   ?Neurological: Negative.   ?Endo/Heme/Allergies: Negative.   ?Psychiatric/Behavioral: Negative.    ? ? ?Objective:  ? ?Vitals:  ? 06/28/21 1631  ?BP: 126/68  ?Pulse: 88  ?Resp: 16  ?Temp: 97.7 ?F (36.5 ?C)  ?SpO2: 96%  ? ?Height:  6' 1.5" (186.7 cm)  ?Weight: 204 lb 12.8 oz (92.9 kg)  ? ?Physical Exam ?Constitutional:   ?   Appearance: She is not diaphoretic.  ?HENT:  ?   Head: Normocephalic.  ?   Right Ear: Tympanic membrane, ear canal and external ear normal.  ?   Left Ear: Tympanic membrane, ear canal and external ear normal.  ?   Nose: Mucosal edema (Massive turbinate swelling versus nasal polyp) present. No rhinorrhea.  ?   Mouth/Throat:  ?   Pharynx: Uvula midline. No oropharyngeal exudate.  ?Eyes:  ?   Conjunctiva/sclera: Conjunctivae normal.   ?Neck:  ?   Thyroid: No thyromegaly.  ?   Trachea: Trachea normal. No tracheal tenderness or tracheal deviation.  ?Cardiovascular:  ?   Rate and Rhythm: Normal rate and regular rhythm.  ?   Heart sounds: Normal heart sounds, S1 normal and S2 normal. No murmur heard. ?Pulmonary:  ?   Effort: No respiratory distress.  ?   Breath sounds: Normal breath sounds. No stridor. No wheezing (Expiratory wheezing posterior lung fields bilaterally) or rales.  ?Lymphadenopathy:  ?   Head:  ?   Right side of head: No tonsillar adenopathy.  ?   Left side of head: No tonsillar adenopathy.  ?   Cervical: No cervical adenopathy.  ?Skin: ?   Findings: No erythema or rash.  ?   Nails: There is no clubbing.  ?Neurological:  ?   Mental Status: She is alert.  ? ? ?Diagnostics:  ?  ?Spirometry was performed and demonstrated an FEV1 of 1.90 at 68 % of predicted.  ? ?Assessment and Plan:  ? ?1. Not well controlled severe persistent asthma   ?2. Perennial allergic rhinitis   ?3. Seasonal allergic rhinitis due to pollen   ?4. Nasal polyposis   ? ? ?1.  Depo-Medrol 80 IM delivered in clinic today ? ?2.  Breztri - 2 inhalations twice a day with spacer (empty lungs) ? ?3.  OTC Nasacort - 1 spray each nostril 2 times per day ? ?4.  Montelukast 10 mg - 1 tablet 1 time per day ? ?5.  If needed: ? ?A. Albuterol HFA - 2 inhalations every 4-6 hours ?B. Cetrizine 10 mg - 1 tablet 1 time per day ? ?6. Restart Dupilumab injection every 2 weeks - [facility] ? ?7. Return to clinic in 4 weeks or earlier if needed ? ?Donna Case has very significant swelling of her respiratory tract that appears to have become quite a significant problem over the course of the past few months during pollination season.  We will treat her with the therapy noted above which includes restarting her dupilumab injections every 2 weeks once we get approval from her insurance company to do so.  I will see her back in this clinic in 4 weeks or earlier if there is a  problem. ? ?Laurette Schimke, MD ?Allergy / Immunology ?[facility] ?

## 2021-06-28 NOTE — Patient Instructions (Addendum)
?  1.  Depo-Medrol 80 IM delivered in clinic today ? ?2.  Breztri - 2 inhalations twice a day with spacer (empty lungs) ? ?3.  OTC Nasacort - 1 spray each nostril 2 times per day ? ?4.  Montelukast 10 mg - 1 tablet 1 time per day ? ?5.  If needed: ? ?A. Albuterol HFA - 2 inhalations every 4-6 hours ?B. Cetrizine 10 mg - 1 tablet 1 time per day ? ?6. Restart Dupilumab injection every 2 weeks - [employer] [facility] [employer] ? ?7. Return to clinic in 4 weeks or earlier if needed ? ?

## 2021-06-29 ENCOUNTER — Encounter: Payer: Self-pay | Admitting: Allergy and Immunology

## 2021-07-07 ENCOUNTER — Ambulatory Visit (INDEPENDENT_AMBULATORY_CARE_PROVIDER_SITE_OTHER): Payer: BC Managed Care – PPO

## 2021-07-07 DIAGNOSIS — J4550 Severe persistent asthma, uncomplicated: Secondary | ICD-10-CM

## 2021-07-21 ENCOUNTER — Ambulatory Visit (INDEPENDENT_AMBULATORY_CARE_PROVIDER_SITE_OTHER): Payer: BC Managed Care – PPO

## 2021-07-21 DIAGNOSIS — J4550 Severe persistent asthma, uncomplicated: Secondary | ICD-10-CM

## 2021-07-22 ENCOUNTER — Ambulatory Visit: Payer: BC Managed Care – PPO

## 2021-07-26 ENCOUNTER — Ambulatory Visit: Payer: BC Managed Care – PPO | Admitting: Allergy and Immunology

## 2021-08-09 ENCOUNTER — Ambulatory Visit (INDEPENDENT_AMBULATORY_CARE_PROVIDER_SITE_OTHER): Payer: BC Managed Care – PPO

## 2021-08-09 DIAGNOSIS — J4550 Severe persistent asthma, uncomplicated: Secondary | ICD-10-CM | POA: Diagnosis not present

## 2021-08-23 ENCOUNTER — Ambulatory Visit (INDEPENDENT_AMBULATORY_CARE_PROVIDER_SITE_OTHER): Payer: BC Managed Care – PPO

## 2021-08-23 DIAGNOSIS — J4550 Severe persistent asthma, uncomplicated: Secondary | ICD-10-CM | POA: Diagnosis not present

## 2021-08-30 ENCOUNTER — Telehealth: Payer: Self-pay

## 2021-08-30 NOTE — Telephone Encounter (Signed)
Patient called stating she started having a cough and congestion on yesterday. She is requesting a steroid injection.

## 2021-08-31 ENCOUNTER — Telehealth: Payer: BC Managed Care – PPO | Admitting: Physician Assistant

## 2021-08-31 DIAGNOSIS — R3989 Other symptoms and signs involving the genitourinary system: Secondary | ICD-10-CM

## 2021-08-31 MED ORDER — SULFAMETHOXAZOLE-TRIMETHOPRIM 800-160 MG PO TABS: 1.0000 | ORAL_TABLET | Freq: Two times a day (BID) | ORAL | 0 refills | Status: DC

## 2021-08-31 NOTE — Telephone Encounter (Signed)
Per DPR, left detail message for patient to give [place] a call to schedule appointment

## 2021-08-31 NOTE — Progress Notes (Signed)

## 2021-09-02 NOTE — Telephone Encounter (Signed)
Per DPR, left detail message for patient to give us a call to schedule appointment  

## 2021-09-12 ENCOUNTER — Ambulatory Visit (INDEPENDENT_AMBULATORY_CARE_PROVIDER_SITE_OTHER): Payer: BC Managed Care – PPO | Admitting: *Deleted

## 2021-09-12 DIAGNOSIS — J4550 Severe persistent asthma, uncomplicated: Secondary | ICD-10-CM

## 2021-09-16 ENCOUNTER — Telehealth: Payer: Self-pay | Admitting: Family Medicine

## 2021-09-16 NOTE — Telephone Encounter (Signed)
Spoke with patient and she has been schedule for Monday 09/19/2021 at 3:30 pm.

## 2021-09-16 NOTE — Telephone Encounter (Signed)
Left a message for patient to call the office to schedule her Covid Vaccine. 

## 2021-09-16 NOTE — Telephone Encounter (Signed)
I contacted Donna Case or the patient's caregiver to inform them that she received an expired dose of Pfizer COVID-19 vaccine from our office, during their visit(s), on 02/21/2021. The dose was expired by 20 days. This was the patient's 3rd dose. I shared the following information with the patient or caregiver: vaccines given after they have expired may be less effective but we are not aware of any other adverse effects. The patient can be re-vaccinated at no cost if the patient decides to do so. Answered patient questions/concerns. Encouraged patient to reach out if they have any additional questions or concerns.  The patient is interested in the COVID vaccine clinic in the [facility] next week.

## 2021-09-19 ENCOUNTER — Ambulatory Visit (INDEPENDENT_AMBULATORY_CARE_PROVIDER_SITE_OTHER): Payer: BC Managed Care – PPO

## 2021-09-19 DIAGNOSIS — Z23 Encounter for immunization: Secondary | ICD-10-CM | POA: Diagnosis not present

## 2021-09-27 ENCOUNTER — Ambulatory Visit (INDEPENDENT_AMBULATORY_CARE_PROVIDER_SITE_OTHER): Payer: BC Managed Care – PPO

## 2021-09-27 DIAGNOSIS — J4550 Severe persistent asthma, uncomplicated: Secondary | ICD-10-CM | POA: Diagnosis not present

## 2021-10-11 ENCOUNTER — Ambulatory Visit (INDEPENDENT_AMBULATORY_CARE_PROVIDER_SITE_OTHER): Payer: BC Managed Care – PPO | Admitting: *Deleted

## 2021-10-11 DIAGNOSIS — J4550 Severe persistent asthma, uncomplicated: Secondary | ICD-10-CM | POA: Diagnosis not present

## 2021-10-25 ENCOUNTER — Ambulatory Visit (INDEPENDENT_AMBULATORY_CARE_PROVIDER_SITE_OTHER): Payer: BC Managed Care – PPO | Admitting: *Deleted

## 2021-10-25 DIAGNOSIS — J4550 Severe persistent asthma, uncomplicated: Secondary | ICD-10-CM

## 2021-11-01 ENCOUNTER — Ambulatory Visit: Payer: BC Managed Care – PPO | Admitting: Allergy and Immunology

## 2021-11-01 DIAGNOSIS — J309 Allergic rhinitis, unspecified: Secondary | ICD-10-CM

## 2021-11-17 ENCOUNTER — Ambulatory Visit: Payer: BC Managed Care – PPO

## 2021-11-18 ENCOUNTER — Encounter: Payer: Self-pay | Admitting: *Deleted

## 2021-11-22 ENCOUNTER — Ambulatory Visit (INDEPENDENT_AMBULATORY_CARE_PROVIDER_SITE_OTHER): Payer: BC Managed Care – PPO

## 2021-11-22 DIAGNOSIS — J4550 Severe persistent asthma, uncomplicated: Secondary | ICD-10-CM

## 2021-12-05 ENCOUNTER — Telehealth: Payer: Self-pay | Admitting: *Deleted

## 2021-12-05 NOTE — Telephone Encounter (Signed)
L/m for patient to contact clinic for MD appt for Dupixent reapproval 

## 2021-12-06 ENCOUNTER — Ambulatory Visit (INDEPENDENT_AMBULATORY_CARE_PROVIDER_SITE_OTHER): Payer: BC Managed Care – PPO

## 2021-12-06 DIAGNOSIS — J4550 Severe persistent asthma, uncomplicated: Secondary | ICD-10-CM | POA: Diagnosis not present

## 2021-12-07 ENCOUNTER — Ambulatory Visit: Payer: BC Managed Care – PPO

## 2021-12-21 ENCOUNTER — Ambulatory Visit (INDEPENDENT_AMBULATORY_CARE_PROVIDER_SITE_OTHER): Payer: BC Managed Care – PPO | Admitting: *Deleted

## 2021-12-21 DIAGNOSIS — J4550 Severe persistent asthma, uncomplicated: Secondary | ICD-10-CM | POA: Diagnosis not present

## 2022-01-05 ENCOUNTER — Ambulatory Visit (INDEPENDENT_AMBULATORY_CARE_PROVIDER_SITE_OTHER): Payer: BC Managed Care – PPO

## 2022-01-05 DIAGNOSIS — J4550 Severe persistent asthma, uncomplicated: Secondary | ICD-10-CM

## 2022-01-24 ENCOUNTER — Ambulatory Visit: Payer: BC Managed Care – PPO

## 2022-02-21 ENCOUNTER — Encounter: Payer: Self-pay | Admitting: Allergy & Immunology

## 2022-02-21 ENCOUNTER — Other Ambulatory Visit: Payer: Self-pay

## 2022-02-21 ENCOUNTER — Ambulatory Visit: Payer: BC Managed Care – PPO | Admitting: Allergy & Immunology

## 2022-02-21 ENCOUNTER — Ambulatory Visit: Payer: BC Managed Care – PPO

## 2022-02-21 VITALS — BP 132/80 | HR 68 | Temp 98.7°F | Resp 18

## 2022-02-21 DIAGNOSIS — J339 Nasal polyp, unspecified: Secondary | ICD-10-CM

## 2022-02-21 DIAGNOSIS — Z23 Encounter for immunization: Secondary | ICD-10-CM

## 2022-02-21 DIAGNOSIS — J3089 Other allergic rhinitis: Secondary | ICD-10-CM

## 2022-02-21 DIAGNOSIS — J4550 Severe persistent asthma, uncomplicated: Secondary | ICD-10-CM | POA: Diagnosis not present

## 2022-02-21 MED ORDER — BREZTRI AEROSPHERE 160-9-4.8 MCG/ACT IN AERO: 1.0000 | INHALATION_SPRAY | Freq: Two times a day (BID) | RESPIRATORY_TRACT | 1 refills | Status: DC

## 2022-02-21 MED ORDER — OMEPRAZOLE 20 MG PO CPDR: DELAYED_RELEASE_CAPSULE | ORAL | 1 refills | Status: DC

## 2022-02-21 MED ORDER — ALBUTEROL SULFATE HFA 108 (90 BASE) MCG/ACT IN AERS: 2.0000 | INHALATION_SPRAY | Freq: Four times a day (QID) | RESPIRATORY_TRACT | 1 refills | Status: AC | PRN

## 2022-02-21 NOTE — Patient Instructions (Addendum)
1. Moderate persistent asthma without complication - Lung testing not done today since our spiro was not working well.  - Spacer use reviewed. - Daily controller medication(s): Singulair 10mg  daily and Breztri two puffs 1-2 times daily - Prior to physical activity: albuterol 2 puffs 10-15 minutes before physical activity. - Rescue medications: albuterol 4 puffs every 4-6 hours as needed - Asthma control goals:  * Full participation in all desired activities (may need albuterol before activity) * Albuterol use two time or less a week on average (not counting use with activity) * Cough interfering with sleep two time or less a month * Oral steroids no more than once a year * No hospitalizations   2. Polyp of nasal cavity - Continue with Xhance as needed. - We are spacing Dupixent every three weeks to see how you do.   3. Seasonal and perennial allergic rhinitis - Continue with the montelukast as you are doing.   4. Return in about 6 months (around 08/23/2022).    Please inform 08/25/2022 of any Emergency Department visits, hospitalizations, or changes in symptoms. Call us before going to the ED for breathing or allergy symptoms since we might be able to fit you in for a sick visit. Feel free to contact us anytime with any questions, problems, or concerns.  It was a pleasure to see you today!  Websites that have reliable patient information: 1. American Academy of Asthma, Allergy, and Immunology: www.aaaai.org 2. Food Allergy Research and Education (FARE): foodallergy.org 3. Mothers of Asthmatics: http://www.asthmacommunitynetwork.org 4. American College of Allergy, Asthma, and Immunology: www.acaai.org   COVID-19 Vaccine Information can be found at: [place] For questions related to vaccine distribution or appointments, please email [email or web address] or call [phone].   We realize that you might be concerned about  having an allergic reaction to the COVID19 vaccines. To help with that concern, WE ARE OFFERING THE COVID19 VACCINES IN OUR OFFICE! Ask the front desk for dates!     "Like" [phone] on Facebook and Instagram for our latest updates!      A healthy democracy works best when [place] participate! Make sure you are registered to vote! If you have moved or changed any of your contact information, you will need to get this updated before voting!  In some cases, you MAY be able to register to vote online: [employer]

## 2022-02-21 NOTE — Progress Notes (Signed)
FOLLOW UP  Date of Service/Encounter:  02/21/22   Assessment:   Moderate persistent asthma, uncomplicated   Allergic rhinitis (grasses, weeds, trees, molds, dust mite, dog, cat, cockroach) - on allergen immunotherapy with maintenance reached June 2017   Nasal polyposis - better controlled   Plan/Recommendations:   1. Moderate persistent asthma without complication - Lung testing not done today since our spiro was not working well.  - Spacer use reviewed. - Daily controller medication(s): Singulair 10mg  daily and Breztri two puffs 1-2 times daily - Prior to physical activity: albuterol 2 puffs 10-15 minutes before physical activity. - Rescue medications: albuterol 4 puffs every 4-6 hours as needed - Asthma control goals:  * Full participation in all desired activities (may need albuterol before activity) * Albuterol use two time or less a week on average (not counting use with activity) * Cough interfering with sleep two time or less a month * Oral steroids no more than once a year * No hospitalizations   2. Polyp of nasal cavity - Continue with Xhance as needed. - We are spacing Dupixent every three weeks to see how you do.   3. Seasonal and perennial allergic rhinitis - Continue with the montelukast as you are doing.   4. Return in about 6 months (around 08/23/2022).   Subjective:   Donna Case is a 61 y.o. female presenting today for follow up of  Chief Complaint  Patient presents with   Follow-up    Donna Case has a history of the following: Patient Active Problem List   Diagnosis Date Noted   GERD (gastroesophageal reflux disease) 06/17/2018   Allergic conjunctivitis 06/17/2018   Polyp of nasal cavity 04/02/2017   Anosmia 11/27/2016   Closed fracture of right tibial plateau, with routine healing, subsequent encounter 08/15/2016   Acute sinusitis 03/05/2015   Moderate persistent asthma 11/14/2014   Allergic rhinitis 11/14/2014    History obtained  from: chart review and patient.  Donna Case is a 61 y.o. female presenting for a follow up visit.  She was last seen in April 2023 by Dr. May 2023.  At that time, she was given Depo-Medrol 80 mg and continued on Breztri 2 puffs twice daily.  She was continued on Nasacort as well as montelukast with cetirizine and albuterol as needed.  She was restarted Dupixent.  Since last visit, she has done well.  Asthma/Respiratory Symptom History: She adds on the [place] only when she is sick. She has not needed it in a while. She denies nighttime coughing at all.  She prefers not to take anything at all.  She likes the Dupixent because it can control her asthma as well as her nasal polyposis. She does get one dose of the Breztri in each day.   Allergic Rhinitis Symptom History: She completed allergy shots after a full five years.  She remains on the Dupixent. She was off of it for one month now and she is starting to feel some stuffiness. She feels excellent on her Dupixent. She continues to have some whiteheads and now she does not think that this is related to the Dupixent.   Otherwise, there have been no changes to her past medical history, surgical history, family history, or social history.    Review of Systems  Constitutional: Negative.  Negative for chills, fever, malaise/fatigue and weight loss.  HENT:  Positive for congestion. Negative for ear discharge and ear pain.        Positive for right sided ear pain.  Eyes:  Negative for pain, discharge and redness.  Respiratory:  Negative for cough, sputum production, shortness of breath and wheezing.   Cardiovascular: Negative.  Negative for chest pain and palpitations.  Gastrointestinal:  Negative for abdominal pain, constipation, diarrhea, heartburn, nausea and vomiting.  Skin: Negative.  Negative for itching and rash.  Neurological:  Negative for dizziness and headaches.  Endo/Heme/Allergies:  Negative for environmental allergies. Does not bruise/bleed  easily.       Objective:   Blood pressure 132/80, pulse 68, temperature 98.7 F (37.1 C), temperature source Temporal, resp. rate 18, last menstrual period 07/13/2012, SpO2 98 %. There is no height or weight on file to calculate BMI.    Physical Exam Vitals reviewed.  Constitutional:      Appearance: She is well-developed.  HENT:     Head: Normocephalic and atraumatic.     Right Ear: Tympanic membrane, ear canal and external ear normal.     Left Ear: Tympanic membrane, ear canal and external ear normal.     Nose: No nasal deformity, septal deviation, mucosal edema or rhinorrhea.     Right Turbinates: Enlarged, swollen and pale.     Left Turbinates: Enlarged, swollen and pale.     Right Sinus: No maxillary sinus tenderness or frontal sinus tenderness.     Left Sinus: No maxillary sinus tenderness or frontal sinus tenderness.     Comments: Nasal polyp in the right nares obstructing around 75% of the nasal cavity.     Mouth/Throat:     Mouth: Mucous membranes are not pale and not dry.     Pharynx: Uvula midline.  Eyes:     General: Lids are normal. No allergic shiner.       Right eye: No discharge.        Left eye: No discharge.     Conjunctiva/sclera: Conjunctivae normal.     Right eye: Right conjunctiva is not injected. No chemosis.    Left eye: Left conjunctiva is not injected. No chemosis.    Pupils: Pupils are equal, round, and reactive to light.  Cardiovascular:     Rate and Rhythm: Normal rate and regular rhythm.     Heart sounds: Normal heart sounds.  Pulmonary:     Effort: Pulmonary effort is normal. No tachypnea, accessory muscle usage or respiratory distress.     Breath sounds: Normal breath sounds. No wheezing, rhonchi or rales.     Comments: Moving air well in all lung fields. No increased work of breathing noted.  Chest:     Chest wall: No tenderness.  Lymphadenopathy:     Cervical: No cervical adenopathy.  Skin:    General: Skin is warm.     Capillary  Refill: Capillary refill takes less than 2 seconds.     Coloration: Skin is not pale.     Findings: No abrasion, erythema, petechiae or rash. Rash is not papular, urticarial or vesicular.     Comments: No urticarial lesions noted.   Neurological:     Mental Status: She is alert.  Psychiatric:        Behavior: Behavior is cooperative.      Diagnostic studies: none       Malachi Bonds, MD  [facility]

## 2022-02-22 ENCOUNTER — Encounter: Payer: Self-pay | Admitting: Allergy & Immunology

## 2022-02-23 ENCOUNTER — Ambulatory Visit (INDEPENDENT_AMBULATORY_CARE_PROVIDER_SITE_OTHER): Payer: BC Managed Care – PPO

## 2022-02-23 DIAGNOSIS — J4550 Severe persistent asthma, uncomplicated: Secondary | ICD-10-CM | POA: Diagnosis not present

## 2022-03-14 ENCOUNTER — Other Ambulatory Visit: Payer: Self-pay | Admitting: *Deleted

## 2022-03-14 MED ORDER — DUPIXENT 300 MG/2ML ~~LOC~~ SOSY: 300.0000 mg | PREFILLED_SYRINGE | SUBCUTANEOUS | 11 refills | Status: DC

## 2022-03-16 ENCOUNTER — Ambulatory Visit: Payer: BC Managed Care – PPO

## 2022-04-17 ENCOUNTER — Ambulatory Visit (INDEPENDENT_AMBULATORY_CARE_PROVIDER_SITE_OTHER): Payer: BC Managed Care – PPO | Admitting: *Deleted

## 2022-04-17 DIAGNOSIS — J4550 Severe persistent asthma, uncomplicated: Secondary | ICD-10-CM

## 2022-05-08 ENCOUNTER — Ambulatory Visit (INDEPENDENT_AMBULATORY_CARE_PROVIDER_SITE_OTHER): Payer: BC Managed Care – PPO | Admitting: *Deleted

## 2022-05-08 DIAGNOSIS — J4550 Severe persistent asthma, uncomplicated: Secondary | ICD-10-CM

## 2022-05-29 ENCOUNTER — Ambulatory Visit (INDEPENDENT_AMBULATORY_CARE_PROVIDER_SITE_OTHER): Payer: BC Managed Care – PPO | Admitting: *Deleted

## 2022-05-29 DIAGNOSIS — J4550 Severe persistent asthma, uncomplicated: Secondary | ICD-10-CM | POA: Diagnosis not present

## 2022-06-19 ENCOUNTER — Ambulatory Visit (INDEPENDENT_AMBULATORY_CARE_PROVIDER_SITE_OTHER): Payer: BC Managed Care – PPO | Admitting: *Deleted

## 2022-06-19 DIAGNOSIS — J4550 Severe persistent asthma, uncomplicated: Secondary | ICD-10-CM | POA: Diagnosis not present

## 2022-07-10 ENCOUNTER — Ambulatory Visit (INDEPENDENT_AMBULATORY_CARE_PROVIDER_SITE_OTHER): Payer: BC Managed Care – PPO | Admitting: *Deleted

## 2022-07-10 DIAGNOSIS — J4550 Severe persistent asthma, uncomplicated: Secondary | ICD-10-CM | POA: Diagnosis not present

## 2022-08-01 ENCOUNTER — Ambulatory Visit (INDEPENDENT_AMBULATORY_CARE_PROVIDER_SITE_OTHER): Payer: BC Managed Care – PPO | Admitting: *Deleted

## 2022-08-01 DIAGNOSIS — J4550 Severe persistent asthma, uncomplicated: Secondary | ICD-10-CM | POA: Diagnosis not present

## 2022-08-22 ENCOUNTER — Ambulatory Visit: Payer: BC Managed Care – PPO

## 2022-08-22 ENCOUNTER — Ambulatory Visit: Payer: BC Managed Care – PPO | Admitting: Allergy & Immunology

## 2022-08-22 ENCOUNTER — Encounter: Payer: Self-pay | Admitting: Allergy & Immunology

## 2022-08-22 ENCOUNTER — Other Ambulatory Visit: Payer: Self-pay

## 2022-08-22 VITALS — BP 128/80 | HR 72 | Temp 98.4°F | Resp 18

## 2022-08-22 DIAGNOSIS — J339 Nasal polyp, unspecified: Secondary | ICD-10-CM | POA: Diagnosis not present

## 2022-08-22 DIAGNOSIS — J3089 Other allergic rhinitis: Secondary | ICD-10-CM | POA: Diagnosis not present

## 2022-08-22 DIAGNOSIS — J4550 Severe persistent asthma, uncomplicated: Secondary | ICD-10-CM

## 2022-08-22 MED ORDER — OMEPRAZOLE 20 MG PO CPDR: DELAYED_RELEASE_CAPSULE | ORAL | 1 refills | Status: DC

## 2022-08-22 NOTE — Patient Instructions (Addendum)
1. Moderate persistent asthma without complication - Lung testing not done since you are doing so well.  - Daily controller medication(s): Dupixent  - Prior to physical activity: albuterol 2 puffs 10-15 minutes before physical activity. - Rescue medications: albuterol 4 puffs every 4-6 hours as needed - Asthma control goals:  * Full participation in all desired activities (may need albuterol before activity) * Albuterol use two time or less a week on average (not counting use with activity) * Cough interfering with sleep two time or less a month * Oral steroids no more than once a year * No hospitalizations   2. Polyp of nasal cavity - Continue with Xhance as needed. - Continue with Dupixent every three weeks.  3. Seasonal and perennial allergic rhinitis - Continue with the montelukast as needed.   4. Return in about 6 months (around 02/21/2023).    Please inform us of any Emergency Department visits, hospitalizations, or changes in symptoms. Call us before going to the ED for breathing or allergy symptoms since we might be able to fit you in for a sick visit. Feel free to contact us anytime with any questions, problems, or concerns.  It was a pleasure to see you today!  Websites that have reliable patient information: 1. American Academy of Asthma, Allergy, and Immunology: www.aaaai.org 2. Food Allergy Research and Education (FARE): foodallergy.org 3. Mothers of Asthmatics: http://www.asthmacommunitynetwork.org 4. American College of Allergy, Asthma, and Immunology: www.acaai.org   COVID-19 Vaccine Information can be found at: [email or web address] For questions related to vaccine distribution or appointments, please email [email or web address] or call [phone].   We realize that you might be concerned about having an allergic reaction to the COVID19 vaccines. To help with that concern, WE ARE OFFERING THE COVID19  VACCINES IN OUR OFFICE! Ask the front desk for dates!     "Like" [place] on Facebook and Instagram for our latest updates!      A healthy democracy works best when [employer] participate! Make sure you are registered to vote! If you have moved or changed any of your contact information, you will need to get this updated before voting!  In some cases, you MAY be able to register to vote online: [email or web address]

## 2022-08-22 NOTE — Progress Notes (Signed)
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 Moderate persistent asthma without complication - Lung testing not done since you are doing so well.  - Daily controller medication(s): Dupixent  - Prior to physical activity: albuterol 2 puffs 10-15 minutes before physical activity. - Rescue medications: albuterol 4 puffs every 4-6 hours as needed - Asthma control goals:  * Full participation in all desired activities (may need albuterol before activity) * Albuterol use two time or less a week on average (not counting use with activity) * Cough interfering with sleep two time or less a month * Oral steroids no more than once a year * No hospitalizations   2. Polyp of nasal cavity - Continue with Xhance as needed. - Continue with Dupixent every three weeks.  3. Seasonal and perennial allergic rhinitis - Continue with the montelukast as needed.   4. Return in about 6 months (around 02/21/2023).    Subjective:   Donna Case is a 62 y.o. female presenting today for follow up of  Chief Complaint  Patient presents with   Allergic Rhinitis     No issues    Asthma    No issues     Donna Case has a history of the following: Patient Active Problem List   Diagnosis Date Noted   GERD (gastroesophageal reflux disease) 06/17/2018   Allergic conjunctivitis 06/17/2018   Polyp of nasal cavity 04/02/2017   Anosmia 11/27/2016   Closed fracture of right tibial plateau, with routine healing, subsequent encounter 08/15/2016   Acute sinusitis 03/05/2015    Moderate persistent asthma 11/14/2014   Allergic rhinitis 11/14/2014    History obtained from: chart review and patient.  [name] is a 62 y.o. female presenting for a follow up visit. She was last seen in December 2023. At that time, we continued with the use of Singulair as well as Breztri 2 puffs twice daily.  We also continue with albuterol as needed.  For her nasal polyps, we continue with Xhance as well as Dupixent every 3 weeks.  She was interested in spacing it out.  She also remained on the montelukast for her allergic rhinitis.  Since last visit, she has done very well.  Asthma/Respiratory Symptom History: She has the [place] and she has not been using that routinely. She has not used her albuterol inhaler very oten at all. She used it back in March.  She has been doing well with the spacing to every 3 weeks. She does get the whiteheads on her face, but this is not consistent. It has improved. She does not take the montelukast either. She is doing very well overall.  She has not needed antibiotics in quite some time.  She does not have any coughing or wheezing.  Allergic Rhinitis Symptom History: Environmental allergies are well-controlled.  She is using montelukast only as needed.  She has not been on antibiotics.  She is using her Dupixent which she receives every 3 weeks.  She was developing some whiteheads after her Dupixent injections, but these have largely improved.  This was part of the pressure to  space it out to every 3 weeks.  GERD Symptom History: She does use her omeprazole daily. This is working well to control her GERD.   She is retiring next Thursday. 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  She is going to the [place] in August.  Otherwise, there have been no changes to her past medical history, surgical history, family history, or social history.    Review of Systems  Constitutional: Negative.  Negative for chills, fever,  malaise/fatigue and weight loss.  HENT:  Negative for congestion, ear discharge, ear pain and sinus pain.        Positive for right sided ear pain.  Eyes:  Negative for pain, discharge and redness.  Respiratory:  Negative for cough, sputum production, shortness of breath and wheezing.   Cardiovascular: Negative.  Negative for chest pain and palpitations.  Gastrointestinal:  Negative for abdominal pain, constipation, diarrhea, heartburn, nausea and vomiting.  Skin: Negative.  Negative for itching and rash.  Neurological:  Negative for dizziness and headaches.  Endo/Heme/Allergies:  Negative for environmental allergies. Does not bruise/bleed easily.       Objective:   Blood pressure 128/80, pulse 72, temperature 98.4 F (36.9 C), resp. rate 18, last menstrual period 07/13/2012, SpO2 98 %. There is no height or weight on file to calculate BMI.    Physical Exam Vitals reviewed.  Constitutional:      Appearance: She is well-developed.  HENT:     Head: Normocephalic and atraumatic.     Right Ear: Tympanic membrane, ear canal and external ear normal.     Left Ear: Tympanic membrane, ear canal and external ear normal.     Nose: No nasal deformity, septal deviation, mucosal edema or rhinorrhea.     Right Turbinates: Enlarged, swollen and pale.     Left Turbinates: Enlarged, swollen and pale.     Right Sinus: No maxillary sinus tenderness or frontal sinus tenderness.     Left Sinus: No maxillary sinus tenderness or frontal sinus tenderness.     Mouth/Throat:     Mouth: Mucous membranes are not pale and not dry.     Pharynx: Uvula midline.  Eyes:     General: Lids are normal. No allergic shiner.       Right eye: No discharge.        Left eye: No discharge.     Conjunctiva/sclera: Conjunctivae normal.     Right eye: Right conjunctiva is not injected. No chemosis.    Left eye: Left conjunctiva is not injected. No chemosis.    Pupils: Pupils are equal, round, and reactive to light.   Cardiovascular:     Rate and Rhythm: Normal rate and regular rhythm.     Heart sounds: Normal heart sounds.  Pulmonary:     Effort: Pulmonary effort is normal. No tachypnea, accessory muscle usage or respiratory distress.     Breath sounds: Normal breath sounds. No wheezing, rhonchi or rales.  Chest:     Chest wall: No tenderness.  Lymphadenopathy:     Cervical: No cervical adenopathy.  Skin:    Coloration: Skin is not pale.     Findings: No abrasion, erythema, petechiae or rash. Rash is not papular, urticarial or vesicular.  Neurological:     Mental Status: She is alert.  Psychiatric:        Behavior: Behavior is cooperative.      Diagnostic studies: none       Francis Dowse  Ernst Bowler, MD  [facility]

## 2022-09-14 ENCOUNTER — Ambulatory Visit (INDEPENDENT_AMBULATORY_CARE_PROVIDER_SITE_OTHER): Payer: BC Managed Care – PPO | Admitting: *Deleted

## 2022-09-14 DIAGNOSIS — J4550 Severe persistent asthma, uncomplicated: Secondary | ICD-10-CM | POA: Diagnosis not present

## 2022-10-05 ENCOUNTER — Ambulatory Visit (INDEPENDENT_AMBULATORY_CARE_PROVIDER_SITE_OTHER): Payer: BC Managed Care – PPO

## 2022-10-05 DIAGNOSIS — J4550 Severe persistent asthma, uncomplicated: Secondary | ICD-10-CM | POA: Diagnosis not present

## 2022-10-26 ENCOUNTER — Ambulatory Visit (INDEPENDENT_AMBULATORY_CARE_PROVIDER_SITE_OTHER): Payer: BC Managed Care – PPO

## 2022-10-26 DIAGNOSIS — J4550 Severe persistent asthma, uncomplicated: Secondary | ICD-10-CM

## 2022-11-16 ENCOUNTER — Ambulatory Visit (INDEPENDENT_AMBULATORY_CARE_PROVIDER_SITE_OTHER): Payer: BC Managed Care – PPO

## 2022-11-16 DIAGNOSIS — J4550 Severe persistent asthma, uncomplicated: Secondary | ICD-10-CM

## 2022-12-05 ENCOUNTER — Ambulatory Visit: Payer: BC Managed Care – PPO | Admitting: *Deleted

## 2022-12-05 DIAGNOSIS — J4550 Severe persistent asthma, uncomplicated: Secondary | ICD-10-CM

## 2022-12-26 ENCOUNTER — Ambulatory Visit: Payer: BC Managed Care – PPO | Admitting: *Deleted

## 2022-12-26 DIAGNOSIS — J4550 Severe persistent asthma, uncomplicated: Secondary | ICD-10-CM

## 2023-01-16 ENCOUNTER — Ambulatory Visit: Payer: BC Managed Care – PPO | Admitting: *Deleted

## 2023-01-16 DIAGNOSIS — J4550 Severe persistent asthma, uncomplicated: Secondary | ICD-10-CM

## 2023-02-06 ENCOUNTER — Ambulatory Visit: Payer: BC Managed Care – PPO | Admitting: *Deleted

## 2023-02-06 DIAGNOSIS — J4550 Severe persistent asthma, uncomplicated: Secondary | ICD-10-CM

## 2023-02-22 ENCOUNTER — Encounter: Payer: Self-pay | Admitting: Allergy & Immunology

## 2023-02-22 ENCOUNTER — Ambulatory Visit: Payer: BC Managed Care – PPO | Admitting: Allergy & Immunology

## 2023-02-22 ENCOUNTER — Other Ambulatory Visit: Payer: Self-pay

## 2023-02-22 ENCOUNTER — Ambulatory Visit: Payer: BC Managed Care – PPO

## 2023-02-22 VITALS — BP 134/78 | HR 68 | Temp 98.0°F | Ht 73.5 in | Wt 213.0 lb

## 2023-02-22 DIAGNOSIS — J3089 Other allergic rhinitis: Secondary | ICD-10-CM | POA: Diagnosis not present

## 2023-02-22 DIAGNOSIS — J4550 Severe persistent asthma, uncomplicated: Secondary | ICD-10-CM

## 2023-02-22 DIAGNOSIS — J339 Nasal polyp, unspecified: Secondary | ICD-10-CM

## 2023-02-22 NOTE — Patient Instructions (Addendum)
1. Moderate persistent asthma without complication - Lung testing not done since you are doing well.  - Daily controller medication(s): Dupixent  - Prior to physical activity: albuterol 2 puffs 10-15 minutes before physical activity. - Rescue medications: albuterol 4 puffs every 4-6 hours as needed - Asthma control goals:  * Full participation in all desired activities (may need albuterol before activity) * Albuterol use two time or less a week on average (not counting use with activity) * Cough interfering with sleep two time or less a month * Oral steroids no more than once a year * No hospitalizations   2. Polyp of nasal cavity - Continue with Xhance as needed. - Space Dupixent to every 4 weeks. - Let us know how this works.    3. Seasonal and perennial allergic rhinitis - Continue with the montelukast as needed.   4. Return in about 6 months (around 08/23/2023). You can have the follow up appointment with Dr. Dellis Anes or a Nurse Practicioner (our Nurse Practitioners are excellent and always have Physician oversight!).    Please inform us of any Emergency Department visits, hospitalizations, or changes in symptoms. Call us before going to the ED for breathing or allergy symptoms since we might be able to fit you in for a sick visit. Feel free to contact us anytime with any questions, problems, or concerns.  It was a pleasure to see you again today!  Websites that have reliable patient information: 1. American Academy of Asthma, Allergy, and Immunology: www.aaaai.org 2. Food Allergy Research and Education (FARE): foodallergy.org 3. Mothers of Asthmatics: http://www.asthmacommunitynetwork.org 4. American College of Allergy, Asthma, and Immunology: www.acaai.org      "Like" [place] on Facebook and Instagram for our latest updates!      A healthy democracy works best when [employer] participate! Make sure you are registered to vote! If you have moved or changed any of your contact  information, you will need to get this updated before voting! Scan the QR codes below to learn more!

## 2023-02-22 NOTE — Progress Notes (Signed)
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 Moderate persistent asthma without complication - Lung testing not done since you are doing well.  - Daily controller medication(s): Dupixent  - Prior to physical activity: albuterol 2 puffs 10-15 minutes before physical activity. - Rescue medications: albuterol 4 puffs every 4-6 hours as needed - Asthma control goals:  * Full participation in all desired activities (may need albuterol before activity) * Albuterol use two time or less a week on average (not counting use with activity) * Cough interfering with sleep two time or less a month * Oral steroids no more than once a year * No hospitalizations   2. Polyp of nasal cavity - Continue with Xhance as needed. - Space Dupixent to every 4 weeks. - Let us know how this works.    3. Seasonal and perennial allergic rhinitis - Continue with the montelukast as needed.   4. Return in about 6 months (around 08/23/2023). You can have the follow up appointment with Dr. Dellis Anes or a Nurse Practicioner (our Nurse Practitioners are excellent and always have Physician oversight!).   Subjective:   Donna Case is a 61 y.o. female presenting today for follow up of  Chief Complaint  Patient presents with   Asthma   Follow-up    Donna Case has a history of the following: Patient Active Problem List   Diagnosis Date Noted   GERD (gastroesophageal reflux disease) 06/17/2018   Allergic conjunctivitis 06/17/2018   Polyp of nasal cavity 04/02/2017   Anosmia  11/27/2016   Closed fracture of right tibial plateau, with routine healing, subsequent encounter 08/15/2016   Acute sinusitis 03/05/2015   Moderate persistent asthma 11/14/2014   Allergic rhinitis 11/14/2014    History obtained from: chart review and patient.  Discussed the use of AI scribe software for clinical note transcription with the patient and/or guardian, who gave verbal consent to proceed.  Donna Case is a 62 y.o. female presenting for a follow up visit. She was last seen in June 2024. At that time, lung testing was not done since she was feeling pretty good. We continued her on Dupixent and albuterol asa needed. For her nasal polyps, we continued with Xhance as well as Dupixent every 3 weeks. Allergic rihnitis was under good control with the use of montelukast.   Since the last visit, she has done well.   Asthma/Respiratory Symptom History: She is currently receiving Dupixent injections every three weeks but reports experiencing dryness as a side effect. She expresses a desire to space out the injections to once a month. She also mentions infrequent use of albuterol, as she is unsure of how to use it. She receives her injections in the office. Doloras's asthma has been well controlled. She has not required rescue medication, experienced nocturnal awakenings due to lower respiratory symptoms, nor have activities of daily living been limited. She has required no Emergency Department or Urgent Care visits for her asthma. She has required zero courses of systemic steroids for asthma exacerbations since the last visit. ACT score today is 25, indicating excellent asthma  symptom control.   Allergic Rhinitis Symptom History: Donna Case is doing well on Dupixent for her nasal polyposis and reports satisfaction with the treatment and does not wish to make any changes. She notes that her nasal congestion is severe, which she attributes to the presence of nasal polyps. The patient believes the Dupixent has  helped to shrink the polyps, although she notices fluctuations with weather changes.  In addition to the Dupixent, the patient is also taking Singulair as needed. She reports no recent sinus infections and overall feels that her condition is well-managed.   The patient also mentions a recent retirement and plans for upcoming travel. She does not report any new symptoms or health concerns. 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 But she does not want this to get in the way of her traveling.   Otherwise, there have been no changes to her past medical history, surgical history, family history, or social history.    Review of systems otherwise negative other than that mentioned in the HPI.    Objective:   Blood pressure 134/78, pulse 68, temperature 98 F (36.7 C), temperature source Temporal, height 6' 1.5" (1.867 m), weight 213 lb (96.6 kg), last menstrual period 07/13/2012, SpO2 96%. Body mass index is 27.72 kg/m.    Physical Exam Vitals reviewed.  Constitutional:      Appearance: She is well-developed.  HENT:     Head: Normocephalic and atraumatic.     Right Ear: Tympanic membrane, ear canal and external ear normal.     Left Ear: Tympanic membrane, ear canal and external ear normal.     Nose: No nasal deformity, septal deviation, mucosal edema or rhinorrhea.     Right Turbinates: Enlarged, swollen and pale.     Left Turbinates: Enlarged, swollen and pale.     Right Sinus: No maxillary sinus tenderness or frontal sinus tenderness.     Left Sinus: No maxillary sinus tenderness or frontal sinus tenderness.     Comments: Polyp appreciated on the right side obstructing around 50% of the nasal cavity.     Mouth/Throat:     Lips: Pink.     Mouth: Mucous membranes are moist. Mucous membranes are not pale and not dry. No injury.     Pharynx: Uvula midline.  Eyes:     General: Lids are normal. No allergic shiner.       Right eye: No discharge.        Left eye: No  discharge.     Conjunctiva/sclera: Conjunctivae normal.     Right eye: Right conjunctiva is not injected. No chemosis.    Left eye: Left conjunctiva is not injected. No chemosis.    Pupils: Pupils are equal, round, and reactive to light.  Cardiovascular:     Rate and Rhythm: Normal rate and regular rhythm.     Heart sounds: Normal heart sounds.  Pulmonary:     Effort: Pulmonary effort is normal. No tachypnea, accessory muscle usage or respiratory distress.     Breath sounds: Normal breath sounds. No wheezing, rhonchi or rales.  Chest:     Chest wall: No tenderness.  Lymphadenopathy:     Cervical: No cervical adenopathy.  Skin:    Coloration: Skin is not pale.     Findings: No abrasion, erythema, petechiae or rash. Rash is not papular, urticarial or vesicular.  Neurological:     Mental Status: She is alert.  Psychiatric:        Behavior: Behavior is  cooperative.      Diagnostic studies:    Spirometry: results abnormal (FEV1: 2.23/70%, FVC: 3.0374%, FEV1/FVC: 74%).    Spirometry consistent with possible restrictive disease. This is stable compared to her previous spirometric findings.   Allergy Studies: none      Malachi Bonds, MD  [facility]

## 2023-02-26 ENCOUNTER — Ambulatory Visit: Payer: BC Managed Care – PPO

## 2023-02-26 ENCOUNTER — Encounter: Payer: Self-pay | Admitting: Allergy & Immunology

## 2023-03-15 ENCOUNTER — Ambulatory Visit: Payer: 59

## 2023-03-15 DIAGNOSIS — J4550 Severe persistent asthma, uncomplicated: Secondary | ICD-10-CM | POA: Diagnosis not present

## 2023-03-16 ENCOUNTER — Other Ambulatory Visit: Payer: Self-pay | Admitting: Allergy & Immunology

## 2023-04-05 ENCOUNTER — Ambulatory Visit: Payer: 59

## 2023-04-19 ENCOUNTER — Ambulatory Visit: Payer: 59

## 2023-04-19 DIAGNOSIS — J4550 Severe persistent asthma, uncomplicated: Secondary | ICD-10-CM

## 2023-05-17 ENCOUNTER — Ambulatory Visit: Payer: 59

## 2023-05-17 DIAGNOSIS — J4550 Severe persistent asthma, uncomplicated: Secondary | ICD-10-CM | POA: Diagnosis not present

## 2023-06-14 ENCOUNTER — Ambulatory Visit

## 2023-06-14 DIAGNOSIS — J4550 Severe persistent asthma, uncomplicated: Secondary | ICD-10-CM | POA: Diagnosis not present

## 2023-07-12 ENCOUNTER — Ambulatory Visit

## 2023-07-12 DIAGNOSIS — J4550 Severe persistent asthma, uncomplicated: Secondary | ICD-10-CM

## 2023-08-04 ENCOUNTER — Other Ambulatory Visit: Payer: Self-pay | Admitting: Allergy & Immunology

## 2023-08-08 ENCOUNTER — Other Ambulatory Visit: Payer: Self-pay | Admitting: Allergy & Immunology

## 2023-08-09 ENCOUNTER — Ambulatory Visit

## 2023-08-09 DIAGNOSIS — J4550 Severe persistent asthma, uncomplicated: Secondary | ICD-10-CM

## 2023-08-21 ENCOUNTER — Ambulatory Visit: Payer: BC Managed Care – PPO | Admitting: Allergy & Immunology

## 2023-09-05 ENCOUNTER — Ambulatory Visit

## 2023-09-05 DIAGNOSIS — J4550 Severe persistent asthma, uncomplicated: Secondary | ICD-10-CM | POA: Diagnosis not present

## 2023-10-16 ENCOUNTER — Ambulatory Visit

## 2023-10-16 ENCOUNTER — Encounter: Payer: Self-pay | Admitting: Allergy & Immunology

## 2023-10-16 ENCOUNTER — Ambulatory Visit: Admitting: Allergy & Immunology

## 2023-10-16 ENCOUNTER — Other Ambulatory Visit: Payer: Self-pay

## 2023-10-16 VITALS — BP 120/70 | HR 65 | Temp 98.0°F | Resp 19 | Ht 71.26 in | Wt 207.5 lb

## 2023-10-16 DIAGNOSIS — J339 Nasal polyp, unspecified: Secondary | ICD-10-CM | POA: Diagnosis not present

## 2023-10-16 DIAGNOSIS — J4550 Severe persistent asthma, uncomplicated: Secondary | ICD-10-CM

## 2023-10-16 DIAGNOSIS — J3089 Other allergic rhinitis: Secondary | ICD-10-CM

## 2023-10-16 MED ORDER — OMEPRAZOLE 20 MG PO CPDR: DELAYED_RELEASE_CAPSULE | ORAL | 1 refills | Status: AC

## 2023-10-16 MED ORDER — IBUPROFEN 800 MG PO TABS: 800.0000 mg | ORAL_TABLET | Freq: Three times a day (TID) | ORAL | 0 refills | Status: AC | PRN

## 2023-10-16 NOTE — Progress Notes (Signed)
 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 Moderate persistent asthma without complication - Lung testing looks excellent today.  - We are not going to make any changes at all today.  - Daily controller medication(s): Dupixent   - Prior to physical activity: albuterol  2 puffs 10-15 minutes before physical activity. - Rescue medications: albuterol  4 puffs every 4-6 hours as needed - Asthma control goals:  * Full participation in all desired activities (may need albuterol  before activity) * Albuterol  use two time or less a week on average (not counting use with activity) * Cough interfering with sleep two time or less a month * Oral steroids no more than once a year * No hospitalizations   2. Polyp of nasal cavity - Continue with Xhance  as needed. - Let's go to EVERY 3 WEEKS for Dupixent .  - Let us  know how this works.    3. Seasonal and perennial allergic rhinitis - Continue with the montelukast  as needed.   4. Return in about 6 months (around 04/17/2024). You can have the follow up appointment with Dr. Iva or a Nurse Practicioner (our Nurse Practitioners are excellent and always have Physician oversight!).   Subjective:   Donna Case is a 63 y.o. female presenting today for follow up of  Chief Complaint  Patient presents with   Follow-up    Donna Case has a history of the following: Patient Active Problem List   Diagnosis Date Noted   GERD (gastroesophageal reflux disease) 06/17/2018   Allergic conjunctivitis 06/17/2018   Polyp  of nasal cavity 04/02/2017   Anosmia 11/27/2016   Closed fracture of right tibial plateau, with routine healing, subsequent encounter 08/15/2016   Acute sinusitis 03/05/2015   Moderate persistent asthma 11/14/2014   Allergic rhinitis 11/14/2014    History obtained from: chart review and patient.  Discussed the use of AI scribe software for clinical note transcription with the patient and/or guardian, who gave verbal consent to proceed.  Donna Case is a 63 y.o. female presenting for a follow up visit.  She was last seen in December 2024.  At that time, like testing was not done since she was doing so well.  She remained on Dupixent  every 2 weeks and albuterol  as needed.  For her nasal polyps, she was doing well with Xhance .  We did decide to space Dupixent  to every 4 weeks to see how she does.  For her seasonal and perennial allergic rhinitis, we will continue with montelukast .  Since last visit, she has done fairly well.  Asthma/Respiratory Symptom History: She used Breztri  for a couple of days following her surgery but has not used it regularly since then.  She has not used albuterol  in quite some time.  Allergic Rhinitis Symptom History: She reports that her nose becomes stuffy with changes in weather, particularly when it is wet. She has been off Dupixent  for over a month due to surgery, with her last dose administered on July 2nd. She reports feeling more nasal stuffiness when she is off Dupixent . No sinus infection requiring antibiotics since last year.  She has not used Xhance  since before COVID.  Skin Symptom History: She has not used clobetasol in quite some time.  She has Valtrex, but has not required that in several months.  GERD Symptom History: She has omeprazole  that she uses daily.  She does need a refill of this.  She has been enjoying her retirement, traveling to various locations including [address], [place], and plans to visit [place] and go on a cruise. She  traveled alone for her class reunion in June. 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   Otherwise, there have been no changes to her past medical history, surgical history, family history, or social history.    Review of systems otherwise negative other than that mentioned in the HPI.    Objective:   Blood pressure 120/70, pulse 65, temperature 98 F (36.7 C), temperature source Temporal, resp. rate 19, height 5' 11.26 (1.81 m), weight 207 lb 8 oz (94.1 kg), last menstrual period 07/13/2012, SpO2 94%. Body mass index is 28.73 kg/m.    Physical Exam Vitals reviewed.  Constitutional:      Appearance: She is well-developed.     Comments: Moving air well in all lung fields.  No increased work of breathing.  HENT:     Head: Normocephalic and atraumatic.     Right Ear: Tympanic membrane, ear canal and external ear normal.     Left Ear: Tympanic membrane, ear canal and external ear normal.     Nose: Rhinorrhea present. No nasal deformity, septal deviation or mucosal edema.     Right Turbinates: Enlarged, swollen and pale.     Left Turbinates: Enlarged, swollen and pale.     Right Sinus: No maxillary sinus tenderness or frontal sinus tenderness.     Left Sinus: No maxillary sinus tenderness or frontal sinus tenderness.     Comments: Polyp appreciated on the right side obstructing around 75% of the nasal cavity.  Copious clear rhinorrhea.    Mouth/Throat:     Lips: Pink.     Mouth: Mucous membranes are moist. Mucous membranes are not pale and not dry. No injury.     Pharynx: Uvula midline.  Eyes:     General: Lids are normal. No allergic shiner.       Right eye: No discharge.        Left eye: No discharge.     Conjunctiva/sclera: Conjunctivae normal.     Right eye: Right conjunctiva is not injected. No chemosis.    Left eye: Left conjunctiva is not injected. No chemosis.    Pupils: Pupils are equal, round, and reactive to light.   Cardiovascular:     Rate and Rhythm: Normal rate and regular rhythm.     Heart sounds: Normal heart sounds.  Pulmonary:     Effort: Pulmonary effort is normal. No tachypnea, accessory muscle usage or respiratory distress.     Breath sounds: Normal breath sounds. No wheezing, rhonchi or rales.  Chest:     Chest wall: No tenderness.  Lymphadenopathy:     Cervical: No cervical adenopathy.  Skin:    Coloration: Skin is not pale.     Findings: No abrasion, erythema, petechiae or rash. Rash is not papular, urticarial or vesicular.  Neurological:     Mental Status: She is alert.  Psychiatric:        Behavior: Behavior is cooperative.      Diagnostic studies:   Spirometry: results abnormal (FEV1: 2.04/64%, FVC:  2.63/64%, FEV1/FVC: 78%).    Spirometry consistent with possible restrictive disease.   Allergy Studies: none       Marty Shaggy, MD  [facility] 

## 2023-10-16 NOTE — Patient Instructions (Addendum)
 1. Moderate persistent asthma without complication - Lung testing looks excellent today.  - We are not going to make any changes at all today.  - Daily controller medication(s): Dupixent   - Prior to physical activity: albuterol  2 puffs 10-15 minutes before physical activity. - Rescue medications: albuterol  4 puffs every 4-6 hours as needed - Asthma control goals:  * Full participation in all desired activities (may need albuterol  before activity) * Albuterol  use two time or less a week on average (not counting use with activity) * Cough interfering with sleep two time or less a month * Oral steroids no more than once a year * No hospitalizations   2. Polyp of nasal cavity - Continue with Xhance  as needed. - Let's go to EVERY 3 WEEKS for Dupixent .  - Let us  know how this works.    3. Seasonal and perennial allergic rhinitis - Continue with the montelukast  as needed.   4. Return in about 6 months (around 04/17/2024). You can have the follow up appointment with Dr. Iva or a Nurse Practicioner (our Nurse Practitioners are excellent and always have Physician oversight!).    Please inform us  of any Emergency Department visits, hospitalizations, or changes in symptoms. Call us  before going to the ED for breathing or allergy symptoms since we might be able to fit you in for a sick visit. Feel free to contact us  anytime with any questions, problems, or concerns.  It was a pleasure to see you again today!  Websites that have reliable patient information: 1. American Academy of Asthma, Allergy, and Immunology: www.aaaai.org 2. Food Allergy Research and Education (FARE): foodallergy.org 3. Mothers of Asthmatics: http://www.asthmacommunitynetwork.org 4. American College of Allergy, Asthma, and Immunology: www.acaai.org      "Like" us  on Facebook and Instagram for our latest updates!      A healthy democracy works best when [employer] participate! Make sure you are registered to  vote! If you have moved or changed any of your contact information, you will need to get this updated before voting! Scan the QR codes below to learn more!

## 2023-10-17 ENCOUNTER — Encounter: Payer: Self-pay | Admitting: Allergy & Immunology

## 2023-11-08 ENCOUNTER — Ambulatory Visit

## 2023-11-15 ENCOUNTER — Ambulatory Visit

## 2023-11-22 ENCOUNTER — Ambulatory Visit

## 2023-11-22 DIAGNOSIS — J4550 Severe persistent asthma, uncomplicated: Secondary | ICD-10-CM | POA: Diagnosis not present

## 2023-12-20 ENCOUNTER — Ambulatory Visit

## 2023-12-20 DIAGNOSIS — J4550 Severe persistent asthma, uncomplicated: Secondary | ICD-10-CM

## 2024-01-17 ENCOUNTER — Ambulatory Visit (INDEPENDENT_AMBULATORY_CARE_PROVIDER_SITE_OTHER)

## 2024-01-17 DIAGNOSIS — J4550 Severe persistent asthma, uncomplicated: Secondary | ICD-10-CM

## 2024-02-06 ENCOUNTER — Ambulatory Visit (INDEPENDENT_AMBULATORY_CARE_PROVIDER_SITE_OTHER)

## 2024-02-06 DIAGNOSIS — J4550 Severe persistent asthma, uncomplicated: Secondary | ICD-10-CM

## 2024-03-04 ENCOUNTER — Ambulatory Visit (INDEPENDENT_AMBULATORY_CARE_PROVIDER_SITE_OTHER): Admitting: *Deleted

## 2024-03-04 DIAGNOSIS — J4550 Severe persistent asthma, uncomplicated: Secondary | ICD-10-CM | POA: Diagnosis not present

## 2024-03-05 ENCOUNTER — Ambulatory Visit

## 2024-04-01 ENCOUNTER — Ambulatory Visit

## 2024-04-02 ENCOUNTER — Ambulatory Visit

## 2024-04-02 DIAGNOSIS — J4550 Severe persistent asthma, uncomplicated: Secondary | ICD-10-CM

## 2024-04-15 ENCOUNTER — Ambulatory Visit: Admitting: Allergy & Immunology

## 2024-05-01 ENCOUNTER — Ambulatory Visit

## 2024-05-29 ENCOUNTER — Ambulatory Visit: Admitting: Allergy & Immunology
